# Patient Record
Sex: Male | Born: 1945 | Race: White | Hispanic: No | Marital: Married
Health system: Southern US, Community
[De-identification: ages and names within clinical notes are randomized; demographics above are authoritative.]

---

## 2004-04-11 ENCOUNTER — Ambulatory Visit: Payer: Self-pay

## 2004-11-27 ENCOUNTER — Ambulatory Visit: Payer: Self-pay

## 2007-12-17 ENCOUNTER — Ambulatory Visit: Payer: Self-pay

## 2014-08-03 ENCOUNTER — Inpatient Hospital Stay: Payer: Self-pay | Admitting: Internal Medicine

## 2014-08-13 ENCOUNTER — Ambulatory Visit: Payer: Self-pay | Admitting: Family Medicine

## 2014-08-19 ENCOUNTER — Inpatient Hospital Stay: Payer: Self-pay | Admitting: Internal Medicine

## 2014-08-20 ENCOUNTER — Ambulatory Visit: Payer: Self-pay | Admitting: Urology

## 2014-09-23 ENCOUNTER — Ambulatory Visit
Admit: 2014-09-23 | Disposition: A | Discharge status: Home / Self Care | Payer: Self-pay | Attending: Internal Medicine | Admitting: Internal Medicine

## 2014-09-29 ENCOUNTER — Inpatient Hospital Stay
Admit: 2014-09-29 | Disposition: A | Discharge status: Hospice | Payer: Self-pay | Attending: Internal Medicine | Admitting: Internal Medicine

## 2014-09-29 LAB — PHOSPHORUS: PHOSPHORUS: 2.4 mg/dL — AB

## 2014-09-29 LAB — COMPREHENSIVE METABOLIC PANEL
ALBUMIN: 2.1 g/dL — AB
ALK PHOS: 242 U/L — AB
ALT: 33 U/L
AST: 57 U/L — AB
Anion Gap: 8 (ref 7–16)
BILIRUBIN TOTAL: 0.3 mg/dL
BUN: 33 mg/dL — AB
CHLORIDE: 96 mmol/L — AB
CREATININE: 0.9 mg/dL
Calcium, Total: 10.7 mg/dL — ABNORMAL HIGH
Co2: 29 mmol/L
EGFR (African American): >60
EGFR (Non-African Amer.): >60
GLUCOSE: 93 mg/dL
Potassium: 4.6 mmol/L
Sodium: 133 mmol/L — ABNORMAL LOW
TOTAL PROTEIN: 7.8 g/dL

## 2014-09-29 LAB — URINALYSIS, COMPLETE
BILIRUBIN, UR: NEGATIVE
Blood: NEGATIVE
Glucose,UR: NEGATIVE mg/dL (ref 0–75)
Ketone: NEGATIVE
Nitrite: NEGATIVE
Ph: 5 (ref 4.5–8.0)
Protein: NEGATIVE
SPECIFIC GRAVITY: 1.02 (ref 1.003–1.030)

## 2014-09-29 LAB — PROTIME-INR
INR: 1.1
PROTHROMBIN TIME: 14.8 s

## 2014-09-29 LAB — CBC WITH DIFFERENTIAL/PLATELET
Basophil #: 0.1 10*3/uL (ref 0.0–0.1)
Basophil %: 0.5 %
Eosinophil #: 0 10*3/uL (ref 0.0–0.7)
Eosinophil %: 0.3 %
HCT: 33.5 % — ABNORMAL LOW (ref 40.0–52.0)
HGB: 10.5 g/dL — ABNORMAL LOW (ref 13.0–18.0)
LYMPHS PCT: 4.9 %
Lymphocyte #: 0.8 10*3/uL — ABNORMAL LOW (ref 1.0–3.6)
MCH: 25.4 pg — ABNORMAL LOW (ref 26.0–34.0)
MCHC: 31.3 g/dL — ABNORMAL LOW (ref 32.0–36.0)
MCV: 81 fL (ref 80–100)
MONOS PCT: 9.1 %
Monocyte #: 1.5 x10 3/mm — ABNORMAL HIGH (ref 0.2–1.0)
NEUTROS ABS: 14.2 10*3/uL — AB (ref 1.4–6.5)
Neutrophil %: 85.2 %
PLATELETS: 409 10*3/uL (ref 150–440)
RBC: 4.14 10*6/uL — ABNORMAL LOW (ref 4.40–5.90)
RDW: 17.7 % — ABNORMAL HIGH (ref 11.5–14.5)
WBC: 16.6 10*3/uL — AB (ref 3.8–10.6)

## 2014-09-29 LAB — LACTIC ACID, PLASMA
Lactic Acid, Venous: 1.4 mmol/L
Lactic Acid, Venous: 1.1 mmol/L

## 2014-09-29 LAB — TROPONIN I: Troponin-I: 0.03 ng/mL

## 2014-09-29 LAB — MAGNESIUM: Magnesium: 2.2 mg/dL

## 2014-09-30 LAB — CBC WITH DIFFERENTIAL/PLATELET
Basophil #: 0 10*3/uL (ref 0.0–0.1)
Basophil %: 0.3 %
EOS PCT: 0.2 %
Eosinophil #: 0 10*3/uL (ref 0.0–0.7)
HCT: 31.1 % — AB (ref 40.0–52.0)
HGB: 9.4 g/dL — ABNORMAL LOW (ref 13.0–18.0)
LYMPHS ABS: 0.8 10*3/uL — AB (ref 1.0–3.6)
Lymphocyte %: 6.4 %
MCH: 24.6 pg — AB (ref 26.0–34.0)
MCHC: 30.2 g/dL — ABNORMAL LOW (ref 32.0–36.0)
MCV: 82 fL (ref 80–100)
MONOS PCT: 8 %
Monocyte #: 1 x10 3/mm (ref 0.2–1.0)
Neutrophil #: 10.3 10*3/uL — ABNORMAL HIGH (ref 1.4–6.5)
Neutrophil %: 85.1 %
Platelet: 315 10*3/uL (ref 150–440)
RBC: 3.82 10*6/uL — ABNORMAL LOW (ref 4.40–5.90)
RDW: 17.7 % — ABNORMAL HIGH (ref 11.5–14.5)
WBC: 12.1 10*3/uL — AB (ref 3.8–10.6)

## 2014-09-30 LAB — COMPREHENSIVE METABOLIC PANEL
ALK PHOS: 181 U/L — AB
ALT: 28 U/L
ANION GAP: 4 — AB (ref 7–16)
Albumin: 1.7 g/dL — ABNORMAL LOW
BUN: 28 mg/dL — AB
Bilirubin,Total: 0.7 mg/dL
CO2: 27 mmol/L
CREATININE: 0.79 mg/dL
Calcium, Total: 9.6 mg/dL
Chloride: 102 mmol/L
EGFR (African American): >60
EGFR (Non-African Amer.): >60
GLUCOSE: 91 mg/dL
Potassium: 4.2 mmol/L
SGOT(AST): 49 U/L — ABNORMAL HIGH
SODIUM: 133 mmol/L — AB
TOTAL PROTEIN: 6.3 g/dL — AB

## 2014-10-01 LAB — BASIC METABOLIC PANEL
ANION GAP: 5 — AB (ref 7–16)
BUN: 22 mg/dL — AB
Calcium, Total: 10 mg/dL
Chloride: 103 mmol/L
Co2: 26 mmol/L
Creatinine: 0.71 mg/dL
EGFR (Non-African Amer.): >60
Glucose: 110 mg/dL — ABNORMAL HIGH
Potassium: 4.2 mmol/L
SODIUM: 134 mmol/L — AB

## 2014-10-01 LAB — CBC WITH DIFFERENTIAL/PLATELET
Basophil #: 0 10*3/uL (ref 0.0–0.1)
Basophil %: 0.1 %
EOS ABS: 0 10*3/uL (ref 0.0–0.7)
EOS PCT: 0 %
HCT: 32.3 % — ABNORMAL LOW (ref 40.0–52.0)
HGB: 9.8 g/dL — AB (ref 13.0–18.0)
Lymphocyte #: 0.5 10*3/uL — ABNORMAL LOW (ref 1.0–3.6)
Lymphocyte %: 3.9 %
MCH: 24.9 pg — AB (ref 26.0–34.0)
MCHC: 30.3 g/dL — ABNORMAL LOW (ref 32.0–36.0)
MCV: 82 fL (ref 80–100)
Monocyte #: 0.9 x10 3/mm (ref 0.2–1.0)
Monocyte %: 6.1 %
NEUTROS PCT: 89.9 %
Neutrophil #: 12.5 10*3/uL — ABNORMAL HIGH (ref 1.4–6.5)
Platelet: 291 10*3/uL (ref 150–440)
RBC: 3.94 10*6/uL — AB (ref 4.40–5.90)
RDW: 17.6 % — ABNORMAL HIGH (ref 11.5–14.5)
WBC: 13.9 10*3/uL — AB (ref 3.8–10.6)

## 2014-10-01 LAB — URINE CULTURE

## 2014-10-02 LAB — VANCOMYCIN, TROUGH: VANCOMYCIN, TROUGH: 37 ug/mL — AB

## 2014-10-02 LAB — URINE CULTURE

## 2014-10-03 LAB — EXPECTORATED SPUTUM ASSESSMENT W REFEX TO RESP CULTURE

## 2014-10-03 LAB — PROTIME-INR
INR: 1.2
Prothrombin Time: 15.2 secs — ABNORMAL HIGH

## 2014-10-03 LAB — CREATININE, SERUM
Creatinine: 0.55 mg/dL — ABNORMAL LOW
EGFR (African American): >60
EGFR (Non-African Amer.): >60

## 2014-10-03 LAB — PLATELET COUNT: Platelet: 261 10*3/uL (ref 150–440)

## 2014-10-03 LAB — EXPECTORATED SPUTUM ASSESSMENT W GRAM STAIN, RFLX TO RESP C

## 2014-10-03 LAB — VANCOMYCIN, TROUGH: Vancomycin, Trough: 17 ug/mL

## 2014-10-03 LAB — APTT: Activated PTT: 29.9 secs (ref 23.6–35.9)

## 2014-10-04 LAB — CREATININE, SERUM
Creatinine: 0.62 mg/dL
EGFR (African American): >60
EGFR (Non-African Amer.): >60

## 2014-10-04 LAB — CULTURE, BLOOD (SINGLE)

## 2014-10-06 LAB — BODY FLUID CULTURE

## 2014-10-17 LAB — SURGICAL PATHOLOGY

## 2014-10-18 ENCOUNTER — Other Ambulatory Visit: Payer: Self-pay

## 2014-10-23 NOTE — Consult Note (Signed)
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  During that admission, he underwent LEFT nephrostomy tube placement as well as placement perinephric drain and ultimately he improved clinically and was discharged.  He has been followed closely in the urology clinic with plans for LEFT nephrectomy, unfortunately, he is readmitted with a sepsis-like picture and was extrconditioned.  Over the past month, he was discharged from rehabilitation but has continued to fail to thrive.  His family/ home health nurse has noted poor appetite and by mouth intake, lethargy, low blood pressure, and overall lack of motivation.  Most recently, he called the office to report that he is having worsening pain, Low blood pressure, increased heart rate, change in the quality of drainage from his perinephric drain all concerning for possible infection.  He was advised to present emergently to the ED which he did.  He was evaluated and admitted to the ICU overnight with presumed sepsis.  He did have an elevated WBC  to 16 which h improved.  He was started on IV vancomycin and Zosyn.  His blood pressure and heart rate improved enough to be stabilized and transferred to the floor this afternoon  and he has not required any pressors.  Unfortunately, CT abdomen and pelvis with contrast shows new finding of evidence of diffuse metastatic disease involving liver, pelvic nodes, left adrenal gland, pelvic lymph nodes, possible caval thromubs all of which are NEW since last imaging in 07/2014.  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    SOCIAL HISTORY:  No alcohol use. Positive for tobacco usage.   FAMILY HISTORY:  Positive for coronary artery disease. No family of prostate cancer.    ALLERGIES:  No known drug allergies.   HOME MEDICATIONS:   benzonatate 200 mg p.o. 3 times daily as needed for cough, Advair 250/50 mcg inhalation 1 puff b.i.d., Perforomist 20 mcg/2 mL 2-minute ventilation b.i.d., ProAir 90 mcg inhalation 2 puffs q. 4 hours as needed for shortness of breath, Pepcid 20 mg p.o. daily, ferrous sulfate 325 mg p.o. b.i.d., senna-S 50/8.6 mg 2 tabs p.o. b.i.d. as needed for constipation, potassium 20 mEq daily.   ROS: + SOB, + weight loss, + fatigue/ weakness, + poor appetitie, + suprapubic pain, + dysuria/ urinary frequency, + n/v, + left flank pain, + fevers/ chills.   All other 12 systems reviewed in detail and otherwise negative than as above.  Exam: AVSS Gen: WD, Obese WM in NAD Skin: Warm/Dry, pale HEENT: [place]/AT, EOMI, anicteric Neck: no masses, no bruits Chest: CTA, nL respiratory effort CV: RRR without M/G/R Abd: mild SP tenderness to palpation without guarding/rebound, no palpable masses/organomegaly; Left PCN in place draining clear yellow urine; Left Percutaneous Urinoma drain in place draining dark red fluid without clots GU: severe phimosis, minimal scrotal edma Neuro: non-focal Psych: A&O x4, pleasant/cooperative Ext: 1+ LE edema   Labs: WBC 16 , elevated LFTs, Cr nl, UCx negative to date (pending), See SCM for details  Imaging: CT abd/ pelvis with contrast 09/29/14 Findings consistent with extensive metastasis in the liver, left adrenal gland,  retroperitoneal lymph nodes, lymph nodes in the porta hepatis, and possibly thrombus/ tumor thrombus involving the left common iliac vein extending into the bilateral external iliac veins. The abnormal retroperitoneal lymph  node is inseparable from the inferior portion of the left kidney which shows heterogeneous abnormal enhancement surrounding a 1.7 cm calcification. There is marked deformity of the left kidney with surrounding left perinephric fluid and air with percutaneous drain in place. The left kidney is smaller compared the pre drainage CT of August 03, 2014. 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  Assessment:   1) Ruptured XGP kidney s/p nephrostomy and perinephric drain 2) Advanced metastatic diease of unknow origin, ? renal origin although mass not apprecitated on previous imaging (severe distortion from chronic obstruction) 3) Sepsis, probably urinary cause 4) Failure to thrive  Recommend: 1) Discussed case with hospitalist, recommend biopsy of liver for metastatic work up 2)  Recommend drain exchange in setting of possible infection in IR 3) Agree with IV abx, adjust as needed based on culture results 4) Finding and implications reviewed with family in detail extensively, patient may no longer be surgical candidate pending above work up 5) Recommend heme/ onc involvement 6) No current plans for urgent surgical intervention 7) Supportivie care  Will fsee again Monday, please call Urology on call if needed over weekend     Electronic Signatures: Claris Gladden (MD)  (Signed on 08-Apr-16 20:52)  Authored  Last Updated: 08-Apr-16 20:52 by Claris Gladden (MD)

## 2014-10-23 NOTE — Consult Note (Signed)
Chief Complaint:  Subjective/Chief Complaint - Pt reports feeling better since admission. - Tolerated clears well - No BM since admission - Has not been oob   VITAL SIGNS/ANCILLARY NOTES: **Vital Signs.:   13-Feb-16 07:00  Temperature Temperature (F) 98  Celsius 36.6  Temperature Source oral  Pulse Pulse 98  Respirations Respirations 17  Systolic BP Systolic BP 75  Diastolic BP (mmHg) Diastolic BP (mmHg) 59  Mean BP 64  Pulse Ox % Pulse Ox % 95  Oxygen Delivery Room Air/ 21 %  *Intake and Output.:   Daily 13-Feb-16 07:00  Grand Totals Intake:  3337 Output:  2850    Net:  487 24 Hr.:  487  IV (Primary)      In:  3125  IV (Primary)      In:  162  IV (Secondary)      In:  50  Urine ml     Out:  1325  Other Output ml     Out:  625  Other Output ml     Out:  900  Length of Stay Totals Intake:  14237 Output:  6675    Net:  7562   Brief Assessment:  GEN no acute distress, obese   Cardiac -- JVD   Respiratory normal resp effort  no use of accessory muscles   Gastrointestinal details normal Nondistended  No rebound tenderness  No gaurding   Gastrointestinal details abnormal Tender....   Tenderness LUQ  LLQ   EXTR negative cyanosis/clubbing, positive edema   Additional Physical Exam Back: Left CVA tenderness.  None on right. Two drains in place. PCN (posterior) with maroon watery outuput.  Perinephric (anterior) drain with similar maroon, slightly viscous output.   Lab Results: Routine Chem:  13-Feb-16 06:42   Glucose, Serum 92  BUN  20  Creatinine (comp) 0.98  Sodium, Serum 140  Potassium, Serum  3.2  Chloride, Serum  111  CO2, Serum 23  Calcium (Total), Serum  8.3  Anion Gap  6  Osmolality (calc) 282  eGFR (African American) >60  eGFR (Non-African American) >60 (eGFR values <60m/min/1.73 m2 may be an indication of chronic kidney disease (CKD). Calculated eGFR, using the MRDR Study equation, is useful in  patients with stable renal function. The eGFR  calculation will not be reliable in acutely ill patients when serum creatinine is changing rapidly. It is not useful in patients on dialysis. The eGFR calculation may not be applicable to patients at the low and high extremes of body sizes, pregnant women, and vegetarians.)  Routine Hem:  13-Feb-16 06:42   WBC (CBC) 9.0  RBC (CBC)  2.83  Hemoglobin (CBC)  7.4  Hematocrit (CBC)  23.5  Platelet Count (CBC) 202  MCV 83  MCH 26.2  MCHC  31.6  RDW  17.5  Neutrophil % 86.2  Lymphocyte % 8.1  Monocyte % 5.2  Eosinophil % 0.1  Basophil % 0.4  Neutrophil #  7.7  Lymphocyte #  0.7  Monocyte # 0.5  Eosinophil # 0.0  Basophil # 0.0 (Result(s) reported on 06 Aug 2014 at 07:13AM.)   Assessment/Plan:  Assessment/Plan:  Assessment 69y/o male with chronically obstructed left kidney s/p massively dilated collecting system rupture with large perinephic colleciton (urinoma) HD #4 s/p drainage of kidney via PCN and peri-nephric collection with drain.  The patient appears to be doing clinically better. He had brief pressor requirement but off for last 16 hours.  His BP is rising.  His WBC is down.  Interestingly  there has been now growth on any cx.  Renal fxn stable (cr decreasing likely representing cessation of urine reabsorption now that drains are in place).  Hct stable but low but pt is asx.   Plan - Continue drainage.  OK to flush each drain daily or BID with 5-10cc of saline.  Pt will likely go home with drains in place (until he gets nephrectomy), may need home health assistance. -  I would consider repeat axial imaging prior to discharge to ensrue there are no residual fluid collections. - Continue IV Abx. F/U cx data.  ID assisting. Would plan for him to tranistion to oral abx (and go home with these). - OK to advance diet. Would go slow (recommend multiple small meals) - Bowel regimen. Consider suppository to aid BM - OOB and ambulation.  Recommend PT assistance. - Consider transfusion if  pt is symptomatic with activity and needs more fluid.  Think drop in blood count is from dilution (30lbs up since admssion) and from mild bleeding from rupture collecting system - Pt will need nephrectomy in near future after he has stabilized well from this current process. - Please call/page with any questions or concerns.   Electronic Signatures: Osa Craver (MD)  (Signed 13-Feb-16 09:09)  Authored: Chief Complaint, VITAL SIGNS/ANCILLARY NOTES, Brief Assessment, Lab Results, Assessment/Plan   Last Updated: 13-Feb-16 09:09 by Osa Craver (MD)

## 2014-10-23 NOTE — Consult Note (Signed)
Chief Complaint:  Subjective/Chief Complaint Dispo planning today, working with PT, ambulated to door with walker.  SP pain nearly resolved.  F/u CT scan showed no new collection, tubes in good position.  Tolerating small amount food without vomiting.   VITAL SIGNS/ANCILLARY NOTES: **Vital Signs.:   02-Mar-16 12:51  Vital Signs Type Q 4hr  Temperature Temperature (F) 98.1  Celsius 36.7  Temperature Source oral  Pulse Pulse 104  Respirations Respirations 18  Systolic BP Systolic BP 353  Diastolic BP (mmHg) Diastolic BP (mmHg) 74  Mean BP 87  Pulse Ox % Pulse Ox % 96  Pulse Ox Activity Level  At rest  Oxygen Delivery Room Air/ 21 %  *Intake and Output.:   Daily 02-Mar-16 07:00  Grand Totals Intake:  2387 Output:  1625    Net:  762 24 Hr.:  762  Oral Intake      In:  240  IV (Primary)      In:  2147  Urine ml     Out:  825  Other Output ml     Out:  230  Other Output ml     Out:  570  Length of Stay Totals Intake:  13430 Output:  7365    Net:  6065   Brief Assessment:  GEN well developed, well nourished, no acute distress, obese, pale appearing   Cardiac Regular  -- LE edema   Respiratory normal resp effort  clear BS  no use of accessory muscles   Gastrointestinal Normal  soft, NT, ND, no suprapubic tenderness   Gastrointestinal details normal No gaurding  No rigidity   EXTR negative cyanosis/clubbing   Additional Physical Exam Left nephrostomy- clear yellow urine Pelvic drain- cranberry colored urine   Lab Results: Routine Chem:  02-Mar-16 04:40   Glucose, Serum 69  BUN  6  Creatinine (comp)  0.53  Sodium, Serum 141  Potassium, Serum 3.6  Chloride, Serum 106  CO2, Serum 26  Calcium (Total), Serum 8.8  Anion Gap 9  Osmolality (calc) 277  eGFR (African American) >60  eGFR (Non-African American) >60 (eGFR values <35m/min/1.73 m2 may be an indication of chronic kidney disease (CKD). Calculated eGFR, using the MRDR Study equation, is useful in   patients with stable renal function. The eGFR calculation will not be reliable in acutely ill patients when serum creatinine is changing rapidly. It is not useful in patients on dialysis. The eGFR calculation may not be applicable to patients at the low and high extremes of body sizes, pregnant women, and vegetarians.)  Routine Hem:  02-Mar-16 04:40   WBC (CBC) 9.5  RBC (CBC)  3.59  Hemoglobin (CBC)  9.5  Hematocrit (CBC)  29.9  Platelet Count (CBC) 306  MCV 83  MCH 26.6  MCHC  31.8  RDW  18.6  Neutrophil % 81.6  Lymphocyte % 9.4  Monocyte % 7.1  Eosinophil % 0.7  Basophil % 1.2  Neutrophil #  7.7  Lymphocyte #  0.9  Monocyte # 0.7  Eosinophil # 0.1  Basophil # 0.1 (Result(s) reported on 24 Aug 2014 at 05:57AM.)   Radiology Results: CT:    01-Mar-16 12:12, CT Abdomen and Pelvis Without Contrast  CT Abdomen and Pelvis Without Contrast   REASON FOR EXAM:    (1) lower abd pain; (2) lower pelvic pain  COMMENTS:   LMP: (Male)    PROCEDURE: CT  - CT ABDOMEN AND PELVIS W0  - Aug 23 2014 12:12PM     CLINICAL  DATA:  Increasing left lower abdomen pain, post left  percutaneous nephrostomy forleft UPJ obstruction due to large  calculus    EXAM:  CT ABDOMEN AND PELVIS WITHOUT CONTRAST    TECHNIQUE:  Multidetector CT imaging of the abdomen and pelvis was performed  following the standard protocol without IV contrast.  COMPARISON:  CT abdomen pelvis of 08/04/2014    FINDINGS:  On lung window images, there is a 7 mm noncalcified nodule within  the right lower lobe present. If the patient is at high risk for  bronchogenic carcinoma, follow-up chest CT at 3-28month is  recommended. If the patient is at low risk for bronchogenic  carcinoma, follow-up chest CT at 6-12 months is recommended. This  recommendation follows the consensus statement: Guidelines for  Management of Small Pulmonary Nodules Detected on CT Scans: A  Statement from the [facility] published in  Radiology  2005; 237:395-400. Pleural thickening in both lung bases remains  with some scarring in the right lower lobe as well. Faintly  calcified hemidiaphragms are noted most likely asbestos related.  On this unenhanced study, the liver is unremarkable. There does  appear to be a 1.5 cm gallstone within the gallbladder which is  faintly calcified. The pancreas is normal in size and the pancreatic  duct is not dilated. The right adrenal gland is unremarkable.The  left adrenal gland is difficult to visualize due to the large left  retroperitoneal soft tissue mass. On this unenhanced study there are  small peripheral defects in the spleen inferiorly which could  represent small areas of infarction. It is difficult to separate  left renal parenchyma from the perinephric process on this  unenhanced study. The drainage catheter is unchanged but the overall  size of the left perinephric/ renal mass has diminished now  measuring 14.5 x 9.9 cm compared to 20.1 x 11.4 cm on 08/04/2014.  The calculi noted previously within the left renal pelvis and  proximally left ureter are unchanged. In view of the difficulty  delineating renal parenchyma from the mass, zanthogranulomatous  pyelonephritis would be a consideration. The right kidney is  unremarkable other than several small nonobstructing right renal  calculi. There is still strandiness surrounding the abdominal aorta  at the level of the renal hilus, which probably is related to the  process involving the left kidney. The abdominal aorta is normal in  caliber.    The urinary bladder is unremarkable. The prostate is normal in size,  again with calcifications present. No pelvic mass or fluid is seen.  The colon is decompressed. The terminal ileum and the appendix are  unremarkable. The lumbar vertebrae are normal alignment with  degenerative disc disease most marked at L4-5.     IMPRESSION:  1. Decrease in size of the left retroperitoneal  mass with difficulty  separating the left renal parenchyma from the left retroperitoneal  process. Cannot exclude xanthogranulomatous pyelonephritis. No  change in position of left renal pelvic and proximal left ureteral  calculi.  2. 1.5 cm gallstone within gallbladder.  3. 7 mm noncalcified nodule in theright lower lobe. Followup  recommendations are given above.      Electronically Signed    By: PIvar DrapeM.D.    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  Improving,  dispo planning today.  CT abd/ pelvis yesterday show drains in good position, no new or enlarging collection.  He will eventually need left nephrectomy but will do best if deconditioning improved preop/ optimize nutrition which was stressed to the patient today.  All questions answered.   Plan -continue drain care -dispo planning -encourage amulation/ nutrition -Urology to sign off, please page with any questions or concerns -we will arrange for clinic follow up in 1-2 weeks to schedule nephrectomy   Electronic Signatures: Sherlynn Stalls (MD)  (Signed 02-Mar-16 16:42)  Authored: Chief Complaint, VITAL SIGNS/ANCILLARY NOTES, Brief Assessment, Lab Results, Radiology Results, Assessment/Plan   Last Updated: 02-Mar-16 16:42 by Sherlynn Stalls (MD)

## 2014-10-23 NOTE — Discharge Summary (Signed)
PATIENT NAME:  [name] [name], [name] [name] MR#:  [id number] DATE OF BIRTH:  1946-01-06  DATE OF ADMISSION:  08/19/2014 DATE OF DISCHARGE:  08/25/2014   PRESENTING COMPLAINT: Nausea, vomiting, and weakness.   DISCHARGE DIAGNOSES:  1.  Sepsis resolved.  2.  Urinary tract infection with Candida.  3.  Left nephrolithiasis status post percutaneous nephrostomy tube placement.  4.  Left-sided uroma status post perinephric tube placement. 5.  Chronic obstructive pulmonary disease.  6.  Ongoing tobacco abuse.  7.  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  8.  Gastroesophageal reflux disease.   PRIMARY CARE PROVIDER: Evelene CroonMeindert Niemeyer, MD    PRIMARY UROLOGIST: Vanna ScotlandAshley Brandon. MD  CONSULTATIONS:  1.  Dr. Vanna ScotlandAshley Brandon, urology.  2.  Dr. Pixie CasinoJ. Kim, urology.  3.  Dr. Trey Paulaichard Hart, urology.   PROCEDURES:  CT scan of abdomen and pelvis without contrast, 08/23/2014, showing decreased in size of the left retroperitoneal mass with difficulty separating left renal parenchyma from left retroperitoneal process. Cannot exclude xanthogranulomatous pyelonephritis, no change in position of the left renal, pelvic and proximal left ureteral calculi, 1.5 cm gallstone within the gallbladder, 7 mm noncalcified nodule in the right lower lobe.   HISTORY OF PRESENT ILLNESS: This 69 year old male with [facility] after a left percutaneous nephrostomy tube placement for obstructing ureteral nephrolithiasis septic shock is sent from the office of his urologist with a complaint of ongoing nausea, vomiting, and generalized weakness.   HOSPITAL COURSE BY PROBLEM:  1.  Sepsis: Due to a urinary tract infection, now resolved.  On presentation, the patient was hypotensive and tachycardic. Blood cultures negative x 2.  Urine culture shows 15,000 colony forming units of Candida albicans.  2.  Urinary tract infection due to candida albicans: The patient was initially started  on broad-spectrum antibacterials but was changed to fluconazole once culture results returned. He did have some initial suprapubic tenderness which has now resolved. He will remain on the fluconazole to complete a 2 week course. Urinary tract infection is complicated by nephrolithiasis, likely a nidus of infection.  3.  Left-sided obstructing nephrolithiasis status post percutaneous nephrostomy tube placement: The patient will be followed by urology.  He also had a perinephric uroma which is being drained with a perinephric tube at this point.  He will follow up with urology in 2 weeks for consideration of left nephrectomy.  3.  Chronic obstructive pulmonary disease: Respiratory status has been stable throughout the hospitalization. He continues on Advair with no respiratory complaints.  4.  Osteoarthritis with opiate dependence: The patient was on scheduled opiates at home, but is on opiates only as needed during this hospitalization. He takes oxycodone every 8 hours as needed and has not actually needed them this frequently.  5.  Generalized weakness: The patient is being discharged to skilled nursing facility for ongoing physical therapy.  6.  Clinical malnutrition:  The patient has lost a significant amount of weight due to lack of appetite, likely due to nephrolithiasis and infection.  He is on Ensure twice a day to boost his nutrition.   DISCHARGE PHYSICAL EXAMINATION:  VITAL SIGNS: Temperature 97.4, pulse 98, respirations 18, blood pressure 124/82, oxygenation 97% on room air.  GENERAL: No acute distress.  RESPIRATORY: Lungs clear to auscultation bilaterally with good air movement.  CARDIOVASCULAR: Regular rate and rhythm. No murmurs, rubs, or gallops. No peripheral edema. Peripheral pulses 2+.  ABDOMEN: Soft, nontender, nondistended. No suprapubic tenderness, bowel sounds are normal.  No guarding,  no rebound, no hepatosplenomegaly.  NEUROLOGICAL: Cranial nerves II through XII grossly intact.  Strength and sensation are intact.  PSYCHIATRIC: The patient alert and oriented with good insight into his clinical conditions.   LABORATORY DATA: Sodium 141, potassium 3.6, chloride 106, bicarbonate 26, BUN 6, creatinine 0.53, hemoglobin 9.5. White blood cell count 9.5, platelets 306,000, MCV is 83.  Urine culture positive for Candida albicans, initial UA shows 62 white blood cells, initial lactic acid 1.5.   DISCHARGE MEDICATIONS:  1.  Advair Diskus 100 mcg/50 mcg 1 puff inhaled twice a day.  2.  Acetaminophen 325 mg 2 tablets every 4 hours as needed for pain or fever.  3.  Docusate sodium 100 mg 1 capsule twice a day.  4.  Famotidine 20 mg 1 tablet once a day.  5.  Ferrous sulfate 325 mg 1 tablet twice a day.  6.  Oxycodone 10 mg 1 tablet every 8 hours as needed for pain.  7.  Meclizine 25 mg 1 tablet 3 times a day as needed for dizziness. 8.  Fluconazole 200 mg 1 tablet once a day. Stop date 09/02/2014.  9.  Ensure 240 mL twice a day.   CONDITION ON DISCHARGE: Stable.   DISPOSITION: Discharge to skilled nursing facility.   DISCHARGE INSTRUCTIONS:   DIET: Low sodium.  ACTIVITY: As tolerated.   FOLLOW-UP:  Follow-up within 2 weeks with urology.   TIME SPENT ON DISCHARGE:  45 minutes.      ____________________________ Ena Dawley. Clent Ridges, MD cpw:DT [name]: 08/25/2014 09:18:00 ET T: 08/25/2014 10:03:06 ET JOB#: [id number]  cc: Santina Evans P. Clent Ridges, MD, <Dictator> Claris Gladden, MD Meindert A. Lacie Scotts, MD Gale Journey MD ELECTRONICALLY SIGNED 09/03/2014 10:54

## 2014-10-23 NOTE — Consult Note (Signed)
Chief Complaint:  Subjective/Chief Complaint Hypotension and tachycardia not improved, stable.  No fevers.  All culture results negative.  AM labs pending.  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   VITAL SIGNS/ANCILLARY NOTES: **Vital Signs.:   12-Feb-16 06:00  Vital Signs Type Routine  Pulse Pulse 110  Respirations Respirations 18  Systolic BP Systolic BP 80  Diastolic BP (mmHg) Diastolic BP (mmHg) 50  Mean BP 60  Pulse Ox Activity Level  At rest  Oxygen Delivery Room Air/ 21 %  *Intake and Output.:   Daily 12-Feb-16 07:00  Grand Totals Intake:  5710 Output:  2050    Net:  3660 24 Hr.:  3660  IV (Primary)      In:  10  IV (Primary)      In:  5500  IV (Secondary)      In:  200  Urine ml     Out:  1300  Other Output ml     Out:  750  Length of Stay Totals Intake:  10650 Output:  3825    Net:  6825   Brief Assessment:  GEN no acute distress, obese   Respiratory normal resp effort  no use of accessory muscles   Gastrointestinal Mild left CVA tenderness, nephrostomy tube in place draining coke colored urine, no clots   Gastrointestinal details normal Soft  mild improving LLQ tenderness   EXTR negative cyanosis/clubbing   Radiology Results: CT:    11-Feb-16 14:59, CT Abdomen and Pelvis Without Contrast  CT Abdomen and Pelvis Without Contrast   REASON FOR EXAM:    (1) Left XGP kidney rupture s/p left PCN, assess   residual collection; (2) Left X  COMMENTS:       PROCEDURE: CT  - CT ABDOMEN AND PELVIS W0  - Aug 04 2014  2:59PM     CLINICAL DATA:  Chronically obstructed left kidney with obstructing  proximal ureteral calculus. 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 The patient is status post placement of a  percutaneous nephrostomy catheter yesterday which has been draining  blood tinged fluid. Clinically,  the patient still has an elevated  white blood cell count, hypotension and worsening renal function.  Imaging is performed to assess for undrained fluid that may require  additional drainagecatheter placement.  EXAM:  CT ABDOMEN AND PELVIS WITHOUT CONTRAST    TECHNIQUE:  Multidetector CT imaging of the abdomen and pelvis was performed  following the standard protocol without IV contrast.    COMPARISON:  CT of the abdomen and pelvis performed yesterday and CT  of the chest with contrast earlier today.    FINDINGS:  A percutaneous drain from a left flank approach enters the markedly  distorted and chronically obstructed left kidney. This courses  superiorly and medially into a dominant pocket of fluid that now  appears decompressed compared to the CT yesterday. There remains a  non-drained drained component of fluid more anteriorly and  inferiorly that measures roughly 12.5 cm in greatest diameter. This  area would be amenable to additional percutaneous catheter drainage  from a lateral/ posterolateral approach to avoid violating the  peritoneal cavity.    Degree of contained hemorrhage within the obstructed kidney as well  as hemorrhage and extravasated fluid inferiorly extending into the  pelvis shows no significant change and there is no evidence to  suggest increased hemorrhage by imaging.  The left Kidney clearly is nonfunctional. Excreted contrast is  present on the right from the CT scan this morning and contrast is  seen throughout the right ureter and in the bladder. There is  absolutely no excretion of contrast by the left kidney. Two separate  calculi are again identified on the left including a probable  culprit calculus near the expected position of the UPJ or proximal  ureter measuring approximately 1.6 x 1.9 x 2.8 cm. Smaller more  superior calculus measures approximately 10 mm.    A small amount of free fluid is present around the liver. Splenic  hypodensity is  less apparent. Gallstone identified.There is a left  inguinal hernia containing fat as well as a small amount of fluid.  Stable degenerative disease of the spine.     IMPRESSION:  After insertion of one percutaneous drain, a superior and medial  dominant pocket of fluid at the level ofthe chronically obstructed  left kidney now appears decompressed. There remains than undrained  component of fluid anteriorly and inferiorly. This should be  amenable to percutaneous CT-guided catheter drainage. The left  kidney is nonfunctional. Degree of contained hemorrhage as well as  extravasated fluid/hemorrhage extending into the pelvis appears  stable with no evidence of increased hemorrhage by imaging.      Electronically Signed    By: Irish LackGlenn  Yamagata M.D.    On: 08/04/2014 16:43         Verified By: Reola CalkinsGLENN T. YAMAGATA, M.D.,   Assessment/Plan:  Assessment/Plan:  Assessment 69 yo M with chronically obstructed, severely hydronephrotic (XGP) left kidney with presenting with left kidney rupture, urinary extravasation, and sepsis.  Patient is PPD #2 s/p left PCN placement.  He remains in ICU, stable tachycardic and hypotention but not requiring pressors.   No fevers and culture negative to date.  AM labs pending.  CT abd/ pelvis shows stable left retroperitoneal collection (12.5 cm) outside of kidney.  Case discussed with IR yesterday, plan for second drain placement today to drain urinoma.   Plan -NPO for procedure today in IR -continue abx -AM labs ordered, trend Cr and Hct -consider Foley placement if Cr continues to trend upwards for maximal urinary decompression  -if hypotension/ tachycardia fails to improve with drainage of urinoma, consider further work up for other underlying etiologies   Electronic Signatures: Claris GladdenBrandon, Lathaniel Legate J (MD)  (Signed 12-Feb-16 07:45)  Authored: Chief Complaint, VITAL SIGNS/ANCILLARY NOTES, Brief Assessment, Radiology Results, Assessment/Plan   Last Updated:  12-Feb-16 07:45 by Claris GladdenBrandon, Trenell Moxey J (MD)

## 2014-10-23 NOTE — Consult Note (Signed)
Chief Complaint:  Subjective/Chief Complaint - HD#5 - Pt feeling a little better - Tolerated solid diet - did NOT get out of bed - last BM 1 week ago   VITAL SIGNS/ANCILLARY NOTES: **Vital Signs.:   14-Feb-16 07:00  Pulse Pulse 108  Respirations Respirations 20  Systolic BP Systolic BP 706  Diastolic BP (mmHg) Diastolic BP (mmHg) 67  Mean BP 83  Pulse Ox % Pulse Ox % 100  Oxygen Delivery Room Air/ 21 %  *Intake and Output.:   Shift 14-Feb-16 07:00  IV (Primary)      In:  1000  IV (Secondary)      In:  125  Urine ml     Out:  775  Other Output ml     Out:  525  Other Output ml     Out:  180  Length of Stay Totals Intake:  17257 Output:  9630    Net:  2376    Daily 07:00  Grand Totals Intake:  3020 Output:  2831    Net:  88 24 Hr.:  590  Oral Intake      In:  120  IV (Primary)      In:  2625  IV (Secondary)      In:  275  Urine ml     Out:  1725  Other Output ml     Out:  525  Other Output ml     Out:  180  Length of Stay Totals Intake:  17257 Output:  9630    Net:  7627   Brief Assessment:  GEN no acute distress, obese   Cardiac + LE edema  -- JVD   Respiratory normal resp effort  no use of accessory muscles   Gastrointestinal details normal Soft  Nondistended  No masses palpable  No gaurding  No rigidity   Gastrointestinal details abnormal Tender....   Tenderness LLQ  RLQ   EXTR positive edema, trace   Additional Physical Exam Patient is tender to palpation in LLQ>RLQ of abdomen but withotu rebound or guarding (this is not new).  He continues to have Left CVA tenderness. His PCN and perinephric drains continue to have similar appearing blood tinged output, appear watery.   Lab Results: Routine Micro:  10-Feb-16 07:30   Micro Text Report BLOOD CULTURE   COMMENT                   NO GROWTH IN 48 HOURS   ANTIBIOTIC                       Micro Text Report BLOOD CULTURE   COMMENT                   NO GROWTH IN 48 HOURS   ANTIBIOTIC                        Culture Comment NO GROWTH IN 48 HOURS  Result(s) reported on 05 Aug 2014 at 07:00AM.  Culture Comment NO GROWTH IN 48 HOURS  Result(s) reported on 05 Aug 2014 at 07:00AM.    10:32   Micro Text Report BLOOD CULTURE   COMMENT                   NO GROWTH IN 48 HOURS   ANTIBIOTIC  Specimen Source RIGHT HAND  Culture Comment NO GROWTH IN 48 HOURS  Result(s) reported on 05 Aug 2014 at 10:00AM.    10:50   Micro Text Report MISC AER/ANAEROBIC CULT.   COMMENT                   NO GROWTH IN 48 HOURS   GRAM STAIN                RARE WHITE BLOOD CELLS   GRAM STAIN                NO ORGANISMS SEEN   ANTIBIOTIC                       Specimen Source LT NEPHROSTOMY TUBE  Culture Comment NO GROWTH IN 94 HOURS  11-Feb-16 12:13   Micro Text Report URINE CULTURE   COMMENT                   MIXED BACTERIAL ORGANISMS   COMMENT                   RESULTS SUGGESTIVE OF CONTAMINATION   ANTIBIOTIC                       Specimen Source CLEAN CATCH  Culture Comment MIXED BACTERIAL ORGANISMS  Routine Chem:  14-Feb-16 06:06   Glucose, Serum 78  BUN 14  Creatinine (comp) 0.77  Sodium, Serum 142  Potassium, Serum  3.3  Chloride, Serum  110  CO2, Serum 23  Calcium (Total), Serum  8.1  Anion Gap 9  Osmolality (calc) 282  eGFR (African American) >60  eGFR (Non-African American) >60 (eGFR values <74m/min/1.73 m2 may be an indication of chronic kidney disease (CKD). Calculated eGFR, using the MRDR Study equation, is useful in  patients with stable renal function. The eGFR calculation will not be reliable in acutely ill patients when serum creatinine is changing rapidly. It is not useful in patients on dialysis. The eGFR calculation may not be applicable to patients at the low and high extremes of body sizes, pregnant women, and vegetarians.)  Routine Hem:  14-Feb-16 06:06   WBC (CBC) 7.7  RBC (CBC)  2.85  Hemoglobin (CBC)  7.5  Hematocrit (CBC)  23.7  Platelet  Count (CBC) 220  MCV 83  MCH 26.2  MCHC  31.5  RDW  17.8  Neutrophil % 82.6  Lymphocyte % 10.4  Monocyte % 5.4  Eosinophil % 0.8  Basophil % 0.8  Neutrophil # 6.4  Lymphocyte #  0.8  Monocyte # 0.4  Eosinophil # 0.1  Basophil # 0.1 (Result(s) reported on 07 Aug 2014 at 06:54AM.)   Radiology Results: CT:    11-Feb-16 14:59, CT Abdomen and Pelvis Without Contrast  CT Abdomen and Pelvis Without Contrast   REASON FOR EXAM:    (1) Left XGP kidney rupture s/p left PCN, assess   residual collection; (2) Left X  COMMENTS:       PROCEDURE: CT  - CT ABDOMEN AND PELVIS W0  - Aug 04 2014  2:59PM     CLINICAL DATA:  Chronically obstructed left kidney with obstructing  proximal ureteral calculus. 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 The patient is status post placement of a  percutaneous nephrostomy catheter yesterday which has been draining  blood tinged fluid. Clinically, the patient still has an  elevated  white blood cell count, hypotension and worsening renal function.  Imaging is performed to assess for undrained fluid that may require  additional drainagecatheter placement.  EXAM:  CT ABDOMEN AND PELVIS WITHOUT CONTRAST    TECHNIQUE:  Multidetector CT imaging of the abdomen and pelvis was performed  following the standard protocol without IV contrast.    COMPARISON:  CT of the abdomen and pelvis performed yesterday and CT  of the chest with contrast earlier today.    FINDINGS:  A percutaneous drain from a left flank approach enters the markedly  distorted and chronically obstructed left kidney. This courses  superiorly and medially into a dominant pocket of fluid that now  appears decompressed compared to the CT yesterday. There remains a  non-drained drained component of fluid more anteriorly and  inferiorly that measures roughly 12.5 cm in greatest diameter. This  area would be  amenable to additional percutaneous catheter drainage  from a lateral/ posterolateral approach to avoid violating the  peritoneal cavity.    Degree of contained hemorrhage within the obstructed kidney as well  as hemorrhage and extravasated fluid inferiorly extending into the  pelvis shows no significant change and there is no evidence to  suggest increased hemorrhage by imaging.    The left Kidney clearly is nonfunctional. Excreted contrast is  present on the right from the CT scan this morning and contrast is  seen throughout the right ureter and in the bladder. There is  absolutely no excretion of contrast by the left kidney. Two separate  calculi are again identified on the left including a probable  culprit calculus near the expected position of the UPJ or proximal  ureter measuring approximately 1.6 x 1.9 x 2.8 cm. Smaller more  superior calculus measures approximately 10 mm.    A small amount of free fluid is present around the liver. Splenic  hypodensity is less apparent. Gallstone identified.There is a left  inguinal hernia containing fat as well as a small amount of fluid.  Stable degenerative disease of the spine.     IMPRESSION:  After insertion of one percutaneous drain, a superior and medial  dominant pocket of fluid at the level ofthe chronically obstructed  left kidney now appears decompressed. There remains than undrained  component of fluid anteriorly and inferiorly. This should be  amenable to percutaneous CT-guided catheter drainage. The left  kidney is nonfunctional. Degree of contained hemorrhage as well as  extravasated fluid/hemorrhage extending into the pelvis appears  stable with no evidence of increased hemorrhage by imaging.      Electronically Signed    By: Aletta Edouard M.D.    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  The patient is making progress. His BP was intermittently low yesterday but overall trending up.  His WBC remains wnl range as does his creatinine.  There remains no growth on any of his cx.  His Hct is stable but low but pt is asx (but has not attempted ambulation).   Plan Continue drainage.  OK to flush each drain daily or BID with 5-10cc of saline.  Pt will likely go home with drains in place (until he gets nephrectomy), may need home health  assistance. - Consider repeat axial imaging prior to discharge to ensure there are no residual fluid collections that may be amenable to reposistioning of dain(s) - Continue IV Abx. F/U cx data.  ID assisting. Would plan for him to tranistion to oral abx (and go home with these) soon - Continue regular diet. HLIV.  Pt is tolerating PO and is 7lbs up since admission (and he says its 20lbs from his baseline) which is likely from IVF resuscitation.  - Bowel regimen. Pt refuses suppository, consider po ducolax - Strongly encouraged pt to get OOB and ambulation.  Recommend PT assistance. - Hct is stable.  Would consider transfusion if pt is hypotensive and symptomatic that does not respond to bolus of fluid.   - Pt will need nephrectomy in near future after he has stabilized well from this current process. - Please call/page with any questions or concerns.   Electronic Signatures: Osa Craver (MD)  (Signed 14-Feb-16 08:30)  Authored: Chief Complaint, VITAL SIGNS/ANCILLARY NOTES, Brief Assessment, Lab Results, Radiology Results, Assessment/Plan   Last Updated: 14-Feb-16 08:30 by Osa Craver (MD)

## 2014-10-23 NOTE — H&P (Signed)
PATIENT NAME:  [name] [name], [name] [name] MR#:  [id number] DATE OF BIRTH:  [birth year]  DATE OF ADMISSION:  08/19/2014  REFERRING PHYSICIAN:  Dr. Governor Rooks.    PRIMARY CARE PRACTITIONER:  Dr. Evelene Croon.     PRIMARY UROLOGIST:  Dr. Vanna Scotland.   ADMITTING PHYSICIAN:  Dr. Jonnie Kind.   CHIEF COMPLAINT: Ongoing nausea, vomiting, with generalized weakness and dizziness for the past 7-10 days.   HISTORY OF PRESENT ILLNESS:  A 69 year old Caucasian male with a recent discharge from [facility] following left percutaneous nephrostomy tube placement for obstructing uretero-nephrolithiasis, septic shock, was sent from the office of his urologist with complaints of ongoing nausea, vomiting with generalized weakness and dizziness for the past 7-10 days. The patient was recently admitted 2-3 weeks ago to [facility] with septic shock and left obstructing nephrolithiasis, during which time he underwent percutaneous nephrostomy tube placement and discharge to rehabilitation and from the rehabilitation he went home 2 days ago and had a followup appointment with his urologist today.  In his urologist's office he was noted to have this ongoing nausea and vomiting with generalized weakness and dizziness ongoing for the past 7-10 days, hence was sent by his urologist to the Emergency Room for further evaluation and management. The patient was evaluated by the ED physician and was found to be tachycardic and with elevated white blood cell count and was initially given IV fluids following which heart rate kind of stabilized, but again heart rate started increasing and was noted to be in dehydration and also with systemic inflammatory response syndrome and a possible UTI, hence hospitalist service was consulted for further evaluation and management. No history of any fever or chills. As mentioned the patient has been having ongoing nausea and vomiting and decreased oral intake for the past 7-10  days and has been feeling dizzy and weak. No focal weakness or numbness. No chest pain. No shortness of breath. No cough. In the Emergency Room blood cultures and urine cultures were obtained and the patient was started on IV ceftriaxone per urology recommendation to cover for possible urinary tract infection.    PAST MEDICAL HISTORY:  1. COPD.   2. Gastroesophageal reflux disease.  3. Left nephrolithiasis status post percutaneous nephrostomy tube placement 2 weeks ago.   PAST SURGICAL HISTORY: No history of any surgeries in the past.   ALLERGIES: No known drug allergies.   FAMILY HISTORY: Brother with myocardial infarction.    SOCIAL HISTORY: He is married, lives at home with his wife. He is a retired Geneticist, molecular and a history of smoking about half pack per day for the past many years. Denies any alcohol or substance abuse.    HOME MEDICATIONS:  1. Acetaminophen 2 tablets every 4 hours as needed for pain.  2. Advair Diskus 100/50 mcg 1 inhalation 2 times a day.  3. Ciprofloxacin 500 mg tablet 1 tablet orally every 12 hours.  4. Docusate sodium 100 mg oral capsule 1 capsule 2 times a day.  5. Famotidine 20 mg tablet 1 tablet orally once a day.  6. Ferrous sulfate 325 mg 1 tablet orally 2 times a day.  7. Macrobid monohydrate 100 mg oral capsule 1 capsule every 12 hours.  8. Oxycodone 10 mg tablet 1 tablet orally 2 times a day.  9. Oxycodone 15 mg oral tablet 1 tablet every 4 hours as needed for pain.  10. Polyethylene glycol oral powder for constipation as needed.      REVIEW OF  SYSTEMS:    CONSTITUTIONAL: Negative for fever or chills. Positive for fatigue, generalized weakness with dizziness.  EYES: Negative for blurred vision, double vision. No pain. No redness. No discharge.  EARS, NOSE, AND THROAT: Negative for tinnitus, ear pain, hearing loss, epistaxis, nasal discharge.  RESPIRATORY: Negative for cough, wheezing, dyspnea, hemoptysis, or painful respiration.   CARDIOVASCULAR: Negative for chest pain, palpitations. No syncopal episodes. No orthopnea. No dyspnea on exertion. No pedal edema. Positive for dizziness.  GASTROINTESTINAL: Positive for nausea, vomiting for the past 7-10 days with lower abdominal discomfort. No diarrhea. No hematemesis. No melena.  GENITOURINARY: Status post left nephrostomy tube draining dark-colored urine. HEMATOLOGIC AND LYMPHATIC: Negative for easy bruising, bleeding.  INTEGUMENTARY:  Negative for acne, skin rash, or lesions.  MUSCULOSKELETAL: Negative for neck pain or back pain. No history of arthritis or gout.  NEUROLOGICAL: Negative for focal weakness, numbness. No history of CVA, TIA, or seizure disorder.  PSYCHIATRIC: Negative for anxiety, insomnia, depression.   PHYSICAL EXAMINATION:  VITAL SIGNS: On arrival to the Emergency Room temperature 98.8 degrees Fahrenheit, pulse rate 117 per minute, respirations 18 per minute, blood pressure 131/72, O2 saturation 96% on room air. Current vital signs, pulse rate 97 per minute, respirations 20 per minute, blood pressure 138/80, oxygen saturation is 100% on room air.  GENERAL: Well developed, well nourished, alert, in no acute distress, comfortable, resting in the bed at this time.  HEAD: Atraumatic, normocephalic.  EYES: Pupils are equal, react to light and accommodation. No conjunctival pallor. No icterus. Extraocular movements normal, intact.  NOSE: No drainage. No lesions.  EARS: No drainage. No external lesions.  ORAL CAVITY: No mucosal lesions. No exudates.  NECK: Supple. No JVD. No thyromegaly. No carotid bruit. Range of motion of neck within normal limits.  RESPIRATORY: Good respiratory effort. Not using accessory muscles of respiration. Bilateral vesicular breath sounds and no rales or rhonchi.  CARDIOVASCULAR: S1, S2 regular. No murmurs, gallops, or clicks. Pulses equal at carotid, femoral, and pedal pulses. No peripheral edema.  GASTROINTESTINAL: Abdomen soft, mild  lower abdominal tenderness present. No hepatosplenomegaly. No masses. No rigidity. No guarding. Percutaneous nephrostomy tubes on the left flank present and draining dark-colored urine.  GENITOURINARY: Deferred.  MUSCULOSKELETAL: No joint tenderness or effusion. Range of motion is adequate. Strength and tone equal bilaterally.  SKIN: Inspection within normal limits. No obvious wounds.  LYMPHATIC: No cervical lymphadenopathy.  VASCULAR: Good dorsalis pedis and posterior tibial pulses.  NEUROLOGICAL: Alert, awake, and oriented x 3. Cranial II through XII grossly intact. No sensory deficit. Motor strength 5/5 bilaterally.  DTRs 2 + bilateral, symmetrical. PSYCHIATRIC: Alert, awake, and oriented x 3. Judgment and insight adequate. Memory and mood within normal limits.   LABORATORY DATA: Serum glucose 104, BUN 11, creatinine 0.77, sodium 138, potassium 3.5, chloride 102, bicarbonate 26, total calcium 9.1. Lipase 68. Total protein 7.0, albumin 2.3, total bilirubin 0.7, alkaline phosphatase 77, AST 18, ALT 6. Troponin less than 0.02. WBC is 12.6, hemoglobin 10.7, hematocrit 33.7, platelet count 359,000. Urinalysis, hazy, 2 + ketones, leukocyte esterase 2 +, RBC 10, WBCs 62 per high-powered field. Lactic acid venous 1.5.   ASSESSMENT AND PLAN: A 69 year old Caucasian male, recent discharge from [facility] following percutaneous nephrostomy tube placement for obstructing left nephrolithiasis, septic shock, presents with ongoing nausea, vomiting for the past few days, with associated dizziness and generalized weakness, was sent here from his urologist's office and admitted because of dehydration and systemic inflammatory response syndrome and possible urinary tract infection.  1.  Dehydration secondary to nausea and vomiting with poor oral intake for the past few days.  2.  Systemic inflammatory response syndrome.   PLAN: Admit to medical floor for IV fluids, follow up urine output,  vitals, antiemetics, and follow up laboratories.   3.  Likely urinary tract infection. The patient was started on IV Rocephin after obtaining blood and urine cultures per urology recommendation. Therefore continue IV Rocephin and follow blood and urine cultures.  4.  Left nephrolithiasis status post percutaneous nephrostomy tube placement under care of urology, stable. Continue care per urology, urology consultation requested.  5.  Chronic obstructive pulmonary disease, stable on home medications. Continue same. No acute problems at this time.  6.  Deep vein thrombosis prophylaxis with sequential compression dressings. No heparin now because of dark-colored urine per nephrostomy, not sure if any hematuria.  7.  Gastrointestinal prophylaxis with proton pump inhibitor.  8.  Tobacco usage. Counseled to quit. The patient not motivated to quit.   CODE STATUS: Full code.   TIME SPENT: 50 minutes.    ____________________________ Crissie FiguresEdavally N. Morning Halberg, MD enr:bu D: 08/19/2014 19:36:05 ET T: 08/19/2014 20:34:41 ET JOB#: [id number]  cc: Crissie FiguresEdavally N. Shunte Senseney, MD, <Dictator> Meindert A. Lacie ScottsNiemeyer, MD Claris GladdenAshley J. Brandon, MD Unknown CC Crissie FiguresEDAVALLY N Annaston Upham MD ELECTRONICALLY SIGNED 08/24/2014 8:51

## 2014-10-23 NOTE — Consult Note (Signed)
PATIENT NAME:  [name] [name], [name] MR#:  [id number] DATE OF BIRTH:  1945-10-30  DATE OF CONSULTATION:  08/03/2014  REFERRING PHYSICIAN:   CONSULTING PHYSICIAN:  Stann Mainlandavid P. Sampson GoonFitzgerald, MD  REFERRING PHYSICIAN:  Dr. Sherryll BurgerShah.   REASON FOR CONSULTATION: Sepsis.   HISTORY OF PRESENT ILLNESS: This is a pleasant 69 year old gentleman with a history of COPD as well as chronic nephrolithiasis and hydronephrosis. He was originally followed at [facility] where it was recommended he undergo nephrectomy, however he was seeking a second opinion. This morning he woke up with worsening flank pain, nausea, vomiting, lightheadedness, and dizziness. He was brought to the Emergency Room where he was found to be hypotensive and had an elevated white count. CT scan showed massive left hydronephrosis with a stone at the left UPJ. There was also evidence of fluid in the abdomen. Since admission the patient has had a stent placed, nephrostomy tube with 800 mL of dark urine obtained. He has been started on Zosyn. Clinically he remains stable and is off pressors.   PAST MEDICAL HISTORY:  1. COPD.   2. Nephrolithiasis.  3. Chronic right hydronephrosis.   PAST SURGICAL HISTORY: None.   SOCIAL HISTORY: Smokes half a pack a day. He is retired. He used to be a Location managermachine operator. No alcohol.   ALLERGIES: No known drug allergies.   MEDICATIONS AT HOME:  Include Advair.    ANTIBIOTICS SINCE ADMISSION: Include Zosyn.   FAMILY HISTORY: Noncontributory.   REVIEW OF SYSTEMS: 11 systems reviewed and negative except as per HPI.    PHYSICAL EXAMINATION:  VITAL SIGNS: Temperature 97.4, pulse 127, blood pressure was 60/40 by EMS and 167 in the ED, saturations are 100% on room air.  GENERAL: He is pleasant, awake, interactive. He is obese.  HEENT: Pupils are reactive. Extraocular movements are intact. Sclerae are anicteric. Oropharynx is clear. No thrush.  NECK: Supple.  HEART: Regular but tachycardic.  LUNGS: Clear.   ABDOMEN: Soft, mildly tender to palpation, distended somewhat.  EXTREMITIES: No clubbing, cyanosis, or edema.  NEUROLOGIC: He is alert and oriented x 3. Grossly nonfocal neurologic exam.   LABORATORY DATA: Blood cultures x 2 are pending and are no growth to date. White blood count on admission was 26,000 on February 10, hemoglobin 1  Renal function shows a creatinine of 1.37, BUN 21. LFTs are normal. CT abdomen and pelvis shows massive left hydronephrosis due to a large stone at the left UPJ with urine and hemorrhage identified in the left abdomen, as well as a small splenic infarct likely related to compression of splenic vasculature by the right kidney. Gallstone without evidence of cholecystitis, small nonobstructing right renal stone, small hiatal hernia.   IMPRESSION: A 69 year old gentleman admitted with urosepsis due to large obstructing stone and hydronephrosis. Cultures are pending from a urine sample after he underwent placement of nephrostomy tube on February 10. 800 mL of dark urine were obtained at the time.   RECOMMENDATIONS:  1.  Continue Zosyn.  2.  Follow up cultures.  3.  Will likely need at least a 21 day course of antibiotics based on culture results.  4.  Thank you for the consult. I will be glad to follow with you.      ____________________________ Stann Mainlandavid P. Sampson GoonFitzgerald, MD dpf:bu D: 08/03/2014 20:15:10 ET T: 08/03/2014 21:05:05 ET JOB#: [id number]  cc: Stann Mainlandavid P. Sampson GoonFitzgerald, MD, <Dictator> Saja Bartolini Sampson GoonFITZGERALD MD ELECTRONICALLY SIGNED 08/16/2014 21:26

## 2014-10-23 NOTE — Consult Note (Signed)
Chief Complaint:  Subjective/Chief Complaint flank/ abd pain improved, remains hypotensive (not on pressors), tachycardic.  in ICU.   VITAL SIGNS/ANCILLARY NOTES: **Vital Signs.:   11-Feb-16 10:00  Vital Signs Type Routine  Pulse Pulse 107  Respirations Respirations 7  Systolic BP Systolic BP 83  Diastolic BP (mmHg) Diastolic BP (mmHg) 54  Mean BP 63  Pulse Ox % Pulse Ox % 100  Pulse Ox Activity Level  At rest  Oxygen Delivery Room Air/ 21 %  *Intake and Output.:   Daily 11-Feb-16 07:00  Grand Totals Intake:  4940 Output:  1775    Net:  3165 24 Hr.:  3007  Oral Intake      In:  240  IV (Primary)      In:  1900  IV (Primary)      In:  2500  IV (Secondary)      In:  300  Urine ml     Out:  1625  Emesis ml     Out:  150  Length of Stay Totals Intake:  4940 Output:  1775    Net:  3165   Brief Assessment:  GEN no acute distress, obese   Respiratory normal resp effort  no use of accessory muscles   Gastrointestinal Mild left CVA tenderness, nephrostomy tube in place draining coke colored urine, no clots   Gastrointestinal details normal Soft  mild improving LLQ tenderness   EXTR negative cyanosis/clubbing   Lab Results: Hepatic:  11-Feb-16 03:11   Bilirubin, Total 0.9  Alkaline Phosphatase 58  SGPT (ALT)  < 6  SGOT (AST) 18  Total Protein, Serum  5.2  Albumin, Serum  1.9  Routine Micro:  10-Feb-16 10:50   Micro Text Report MISC AER/ANAEROBIC CULT.   COMMENT                   NO GROWTH IN 8-12 HOURS   ANTIBIOTIC                       Specimen Source LT NEPHROSTOMY TUBE  Culture Comment NO GROWTH IN 8-12 HOURS  Result(s) reported on 04 Aug 2014 at 11:08AM.  Routine Chem:  11-Feb-16 03:11   Hemoglobin A1c ([facility]) 5.0 (The American Diabetes Association recommends that a primary goal of therapy should be <7% and that physicians should reevaluate the treatment regimen in patients with HbA1c values consistently >8%.)  Glucose, Serum  116  BUN  27  Creatinine  (comp)  1.81  Sodium, Serum 138  Potassium, Serum 4.3  Chloride, Serum 107  CO2, Serum 23  Calcium (Total), Serum  8.4  Osmolality (calc) 282  eGFR (African American)  48  eGFR (Non-African American)  40 (eGFR values <4m/min/1.73 m2 may be an indication of chronic kidney disease (CKD). Calculated eGFR, using the MRDR Study equation, is useful in  patients with stable renal function. The eGFR calculation will not be reliable in acutely ill patients when serum creatinine is changing rapidly. It is not useful in patients on dialysis. The eGFR calculation may not be applicable to patients at the low and high extremes of body sizes, pregnant women, and vegetarians.)  Anion Gap 8  Magnesium, Serum 1.9 (1.8-2.4 THERAPEUTIC RANGE: 4-7 mg/dL TOXIC: > 10 mg/dL  -----------------------)  Routine UA:  11-Feb-16 12:13   Color (UA) Amber  Clarity (UA) Hazy  Glucose (UA) Negative  Bilirubin (UA) Negative  Ketones (UA) Negative  Specific Gravity (UA) 1.045  Blood (UA) Negative  pH (UA)  5.0  Protein (UA) 30 mg/dL  Nitrite (UA) Negative  Leukocyte Esterase (UA) 2+ (Result(s) reported on 04 Aug 2014 at 12:52PM.)  RBC (UA) 22 /HPF  WBC (UA) 24 /HPF  Bacteria (UA) TRACE  Epithelial Cells (UA) 1 /HPF  Amorphous Crystal (UA) PRESENT (Result(s) reported on 04 Aug 2014 at 12:52PM.)  Routine Coag:  11-Feb-16 03:11   Prothrombin  16.4 (11.4-15.0 NOTE: New Reference Range  07/22/14)  INR 1.3 (INR reference interval applies to patients on anticoagulant therapy. A single INR therapeutic range for coumarins is not optimal for all indications; however, the suggested range for most indications is 2.0 - 3.0. Exceptions to the INR Reference Range may include: Prosthetic heart valves, acute myocardial infarction, prevention of myocardial infarction, and combinations of aspirin and anticoagulant. The need for a higher or lower target INR must be assessed individually. Reference: The Pharmacology  and Management of the Vitamin K  antagonists: the seventh ACCP Conference on Antithrombotic and Thrombolytic Therapy. ZOXWR.6045 Sept:126 (3suppl): [id number]. A HCT value >55% may artifactually increase the PT.  In one study,  the increase was an average of 25%. Reference:  "Effect on Routine and Special Coagulation Testing Values of Citrate Anticoagulant Adjustment in Patients with High HCT Values." American Journal of Clinical Pathology 2006;126:400-405.)  Routine Hem:  11-Feb-16 03:11   WBC (CBC)  21.2  RBC (CBC)  3.59  Hemoglobin (CBC)  9.5  Hematocrit (CBC)  29.7  Platelet Count (CBC) 239  MCV 83  MCH 26.4  MCHC 32.0  RDW  17.1  Neutrophil % 87.1  Lymphocyte % 4.8  Monocyte % 7.9  Eosinophil % 0.0  Basophil % 0.2  Neutrophil #  18.5  Lymphocyte # 1.0  Monocyte #  1.7  Eosinophil # 0.0  Basophil # 0.0 (Result(s) reported on 04 Aug 2014 at 03:47AM.)   Assessment/Plan:  Assessment/Plan:  Assessment 69 yo M with chronically obstructed, severely hydronephrotic left kidney with presenting with left kidney rupture, urinary extravasation, and sepsis.  Patient is PPD #1 s/p left PCN placement.  He remains in ICU with improving leukocytosis (27 --> 21) but remains tachycardic, hypotensive but not requiring pressors.  His renal function is also declining (? secondary to hypoperfusion from hypotension).  No fevers and culture negative to date.   Plan -CT abd/ pelvis without contrast today assess for size/ location residual collection outside of kidney which may require percutaneous drainage tomorrow -Case discussed with IR (Dr. Kathlene Cote) -NPO at MN in case drain is required -continue to flush drain -supportive care per critical care/ internal medicine -follow renal function -repeat H/H this evening   Electronic Signatures: Sherlynn Stalls (MD)  (Signed 11-Feb-16 13:40)  Authored: Chief Complaint, VITAL SIGNS/ANCILLARY NOTES, Brief Assessment, Lab Results,  Assessment/Plan   Last Updated: 11-Feb-16 13:40 by Sherlynn Stalls (MD)

## 2014-10-23 NOTE — Consult Note (Signed)
   Comments   Family and pt now requesting hospice screen before biopsy results are received.  Spoke with urologist, Dr. Apolinar JunesBrandon, and she recommends changing nephrostomy tubes prior to discharge for comfort. order hospice screen.  Electronic Signatures: Kataleyah Carducci, Jaquelyn BitterStephen J (NP)  (Signed 11-Apr-16 14:31)  Authored: Palliative Care Phifer, Harriett SineNancy (MD)  (Signed 11-Apr-16 14:51)  Authored: Palliative Care   Last Updated: 11-Apr-16 14:51 by Phifer, Harriett SineNancy (MD)

## 2014-10-23 NOTE — Consult Note (Signed)
Urology Consultation Report for Consultation: Right UPJ Stone s/p Right PCN MD: Jonnie KindEdavally Reddy, MDMD: Marin OlpJay H. Tadeo Besecker, MD 69 y.o WM admitted 08/19/2014 after presenting to the [facility] ER with 7-10 days of N/V, generalized weakness/dizziness. 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  Pt was treated swith Zosyn, then transitioned to Cipro after the UC&S returned mixed.  The pt was transferred to a SNF for Rehab/PT on 08/09/2014.  The pt endorses ongoing difficulties with intermittent N/V while at the SNF, able to tolerate only small amounts of water.  Pt has not had a BM for 3 days (last BM was nL/formed), bt has been passing flatus.  Pt denies voiding difficulty, endorsing small volume voids q3-4 hrs.  Denies dysuria, gross hematuria, F/C.  Pt denies any prior h/o urolithiasis. the ER, the pt was tachycardic despite IV hydration, with a mild leukocystosis.  Urine and blood cultures were sent and the pt started on IV Ceftriaxone.  The pt was admitted for dehydration with SIRS/poss UTI.  The pt has remained afeb with stable vital signs (mild tachycardia at 92-103).  The voided urine output is not being measured.  The PCN drained 450 mL as of 7am with 150mL from the urinoma drain. as per the above H&P and the detailed H&P by Dr. Jonnie KindEdavally Reddy dated 08/19/2014 with the following additions: FH neg for Prostate Cancer AVSS w/97% RA SatWD, Obese WM in NADWarm/Dry, without lesions/rash about the Head/NeckNC/AT, EOMI, anictericno masses, no bruitsCTA, nL respiratory effortRRR without M/G/R, 2+ radial pulsesmild SP tenderness to palpation without guarding/rebound, no palpable masses/organomegaly; Left PCN in place draining clear amber urine; Left Percutaneous Urinoma drain in place draining dark red  fluid without clotssevere phimosis (unable to retract - pt endorses inability to retract the foreskin for 5 yrs); right testis atrophy (life-long, per pt) - NT, nL left testisdecreased tone, 1+ firm prostate (R>L) with nodularity or tenderness; empty rectal vaultno edemanon-focalA&O x4, pleasant/cooperative WBC 9.2k this AM (down form 12.6k in the ER); Cr 0.77 with Ca++ 9.12 yesterday  s/p Left PCN 08/03/2014 for Left UPJ Obstruction due to large left UPJ Calculus with Sepsisthe PCN is draining well and the pt remains afebrile with resolution of the mild leukocytosis on IV Ceftriaxone with stable VS on IV hydration s/p 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  Electronic Signatures: Marin OlpKim, Charita Lindenberger H (MD)  (Signed on 27-Feb-16 13:04)  Authored  Last Updated: 27-Feb-16 13:04 by Marin OlpKim, Evelynn Hench H (MD)

## 2014-10-23 NOTE — Consult Note (Addendum)
PATIENT NAME:  [name] [name], [name] [name] MR#:  [id number] DATE OF BIRTH:  05-26-1946  DATE OF CONSULTATION:  10/03/2014  REFERRING PHYSICIAN:  Dr. Sherryll Burger. CONSULTING PHYSICIAN:  Stann Mainland. Sampson Goon, MD  REASON FOR CONSULTATION: MRSA infection in nephrostomy tube and sputum.   HISTORY OF PRESENT ILLNESS: This is a 69 year old gentleman I have seen in the past who had an admission from February 10 to February 16 and then a repeat admission February 26 through March 3 with sepsis due to left nephrolithiasis. He underwent left percutaneous nephrostomy tube placement in February due to the hydronephrosis and a large obstructive stone. Cultures were negative. He was treated with IV antibiotics and then transitioned to oral ciprofloxacin. He was admitted again April 7 with foul, dark-colored urine and left-sided abdominal pain. He also was having subjective fevers and chills. He was admitted from Dr. Delana Meyer office. 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  Biopsy was done on that today. He has been treated with IV antibiotics and the culture from his nephrostomy tube is growing MRSA as well as Enterococcus. A sputum is also growing MRSA. Clinically, he has improved some.  We are consulted for further antibiotic management.   PAST MEDICAL HISTORY:  1.  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  2.  GERD.  3.  COPD.  4.  Tobacco abuse.   PAST SURGICAL HISTORY: Nephrostomy tube as above.   SOCIAL HISTORY: He smoked quite heavily, 2 packs a day, until February 2016. No alcohol. Lives with his family.   FAMILY HISTORY: Noncontributory.   ALLERGIES: No known drug allergies.  ANTIBIOTICS SINCE ADMISSION INCLUDE:  Vancomycin begun April 7 through April 10, Zosyn given April 6 through current. Other medications are reviewed in his MAR.    REVIEW OF SYSTEMS:  Eleven  systems reviewed and negative except as HPI.   PHYSICAL EXAMINATION:  VITAL SIGNS: Temperature 97.7, pulse 100, blood pressure 110/74, respirations 18, saturation 96% on room air.  GENERAL: He is chronically ill-appearing, lying in bed.  He is in no acute distress, but is somewhat lethargic.  HEENT: Pupils reactive. Sclerae anicteric.  OROPHARYNX: Clear, but mucous membranes are dry.  NECK: Supple.  HEART: Regular.  LUNGS: Coarse breath sounds bilaterally.  ABDOMEN: Soft, distended, obese. He has a drain in his left perinephric area. There are 2 drains, 1 with dark bloody fluid, the other with clearish urine. He is tender to palpation on the left as well as diffusely in his abdomen.  EXTREMITIES: 1+ edema bilaterally.  NEUROLOGIC: He is alert and oriented x 3. Grossly nonfocal neuro exam.   LABORATORY DATA: Blood cultures April 7 x 2:  No growth. Urine culture April 7:  Mixed bacterial organisms suggestive of contamination. Sputum culture April 7:  Light growth MRSA sensitive to Bactrim.  Nephrostomy drain culture mixed bacterial organisms suggestive of contamination April 8. Drainage tube April 8:  Culture is growing moderate MRSA and heavy growth Enterococcus and being held for anaerobes. The MRSA was sensitive to Bactrim. The enterococcus was sensitive to ampicillin. White blood count April 7 was 16.6, down to 13.9 on April 9.  Hemoglobin 9.8, platelets 291,000. Renal function shows a creatinine of 0.55 on April 11. LFTs show quite low albumin at 1.7, alkaline phosphatase 188, AST 49, ALT 28. Troponin was negative. Urinalysis April 7 showed 16 to 30 white cells.   IMAGING:  CT of the abdomen and pelvis April 7 showed extensive metastases in the liver, left adrenal gland, retroperitoneal lymph nodes, lymph nodes in the porta hepatis, and possible tumor thrombus involving the left common iliac vein extending into the bilateral external iliac veins. There is marked deformity of the left kidney with  surrounding left perinephric fluid and air with percutaneous drain in place. The left kidney is smaller compared to pre-drainage CT of 08/03/2014. CT of the chest done April 11 showed interval progression of widespread hepatic metastatic disease. Left adrenal metastases. Widespread pulmonary metastatic disease progressive from February.  No obvious primary malignancy identified in the chest. Enlarging bilateral pleural effusions.   IMPRESSION: A 69 year old gentleman admitted with subjective fevers, elevated white count, and cloudy drainage from his nephrostomy. He has 2 drains in place. CT imaging has shown extensive metastatic disease of unknown primary and he has undergone a liver biopsy February 11 with the findings pending.  Culture from the nephrostomy tube is growing methicillin resistant Staphylococcus aureus as well as Enterococcus. Sputum cultures were growing methicillin resistant Staphylococcus aureus, but there is no real evidence of consolidation.   RECOMMENDATIONS:  1.  I would suggest a course of oral antibiotics to target the methicillin resistant Staphylococcus aureus, Enterococcus, and likely anaerobes in the nephrostomy tube. I would suggest switching him to Bactrim double strength twice a day as well Augmentin 875 twice a day. This will cover the methicillin resistant Staph aureus, the enterococcus and anaerobes.  2.  He is considering hospice. I think that he will need at least a 21 day course of these antibiotics for the infection in the nephrostomy site.  3.  Thank you for the consult. I will be glad to follow with you.   ____________________________ Stann Mainlandavid P. Sampson GoonFitzgerald, MD dpf:sp D: 10/03/2014 16:04:28 ET T: 10/03/2014 16:31:36 ET JOB#: [id number]  cc: Stann Mainlandavid P. Sampson GoonFitzgerald, MD, <Dictator> Erina Hamme Sampson GoonFITZGERALD MD ELECTRONICALLY SIGNED 10/25/2014 10:17

## 2014-10-23 NOTE — Consult Note (Signed)
ONCOLOGY followup note -  HPI: resting in bed. Still very weak but states that he feels slightly better overall. No pain issues.  no fevers. Denies obvious bleeding issues.More alert today and oriented, in no acute distress           vitals - 98, 100, 20, 110/68, 96% on room air.           lungs - b/l good BS           abd - nontenderWBC 13900, ANC [id number], hemoglobin 9.8, platelets 291, creatinine 0.71, calcium 2710.330.  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 The patient's general condition otherwise has been declining lately and he is getting weaker and resting most of the time in bed, (current performance status is ECOG 3 to 4). CT scan of the abdomen and pelvis done reveals extensive liver metastasis, metastatic adenopathy, adrenal lesion and retroperitoneal mass around the left kidney. Findings raise suspicion for stage 4 metastatic malignancy with possible primaries including kidney cancer versus other primary. Hospitalist has already requested for liver biopsy to be done on Monday. CT scan of the chest to see if there is any obvious lung metastasis or lung primary has been requested. Patient also diagnosed with sepsis upon admission, overall more alert today and he feels slightly better, continue ongoing supportive treatment with empiric antibiotics, steroids and other supportive therapy. The patient and family present (wife and daughter) are aware about CT scan findings,  that he has radiological evidence of stage 4 malignancy, which would be incurable and overall prognosis is poor especially since his performance status is declining. In addition, liver lesion seems to have rapidly developed over the last 2 months since they were not reported on CT scans done in February, indicating that this is clinically very aggressive tumor. At this time, they do want to try and get  biopsy and get tissue diagnosis if possible with understanding that his overall condition might be too weak to even consider any cancer treatment. Patient is DO NOT RESUSCITATE status. Will continue to follow.    Electronic Signatures: Izola PricePandit, Delon Revelo Raj (MD)  (Signed on 09-Apr-16 11:23)  Authored  Last Updated: 09-Apr-16 11:23 by Izola PricePandit, Julaine Zimny Raj (MD)

## 2014-10-23 NOTE — Consult Note (Signed)
Chief Complaint:  Subjective/Chief Complaint Urology F/U  Pt without new complaints.  Persistent nausea without emesis.  Bladder Scan PVR 32m this AM, per nursing.   VITAL SIGNS/ANCILLARY NOTES: **Vital Signs.:   28-Feb-16 04:39  Vital Signs Type Q 4hr  Temperature Temperature (F) 98.1  Celsius 36.7  Temperature Source oral  Pulse Pulse 96  Respirations Respirations 18  Systolic BP Systolic BP 1536 Diastolic BP (mmHg) Diastolic BP (mmHg) 70  Mean BP 81  Pulse Ox % Pulse Ox % 97  Pulse Ox Activity Level  At rest  Oxygen Delivery Room Air/ 21 %    08:10  Vital Signs Type Q 4hr  Temperature Temperature (F) 98  Celsius 36.6  Temperature Source oral  Pulse Pulse 96  Respirations Respirations 18  Systolic BP Systolic BP 1644 Diastolic BP (mmHg) Diastolic BP (mmHg) 68  Mean BP 84  Pulse Ox % Pulse Ox % 95  Pulse Ox Activity Level  At rest  Oxygen Delivery Room Air/ 21 %  *Intake and Output.:   Daily 28-Feb-16 07:00  Grand Totals Intake:  3560 Output:  1935    Net:  1625 24 Hr.:  1625  Oral Intake      In:  1845  IV (Primary)      In:  1715  Urine ml     Out:  1080  Other Output ml     Out:  205  Other Output ml     Out:  650  Length of Stay Totals Intake:  4385 Output:  2835    Net:  1550    Shift 15:00  Grand Totals Intake:  412 Output:  325    Net:  87 24 Hr.:  87  Oral Intake      In:  120  IV (Primary)      In:  292  Urine ml     Out:  125  Other Output ml     Out:  50  Other Output ml     Out:  150  Length of Stay Totals Intake:  4797 Output:  3160    Net:  1637   Brief Assessment:  GEN well developed, no acute distress, obese   Respiratory normal resp effort   Lab Results: Routine Chem:  28-Feb-16 05:58   Glucose, Serum 80  BUN 8  Creatinine (comp)  0.51  Sodium, Serum 141  Potassium, Serum  3.2  Chloride, Serum 106  CO2, Serum 25  Calcium (Total), Serum 8.6  Anion Gap 10  Osmolality (calc) 279  eGFR (African American) >60  eGFR  (Non-African American) >60 (eGFR values <613mmin/1.73 m2 may be an indication of chronic kidney disease (CKD). Calculated eGFR, using the MRDR Study equation, is useful in  patients with stable renal function. The eGFR calculation will not be reliable in acutely ill patients when serum creatinine is changing rapidly. It is not useful in patients on dialysis. The eGFR calculation may not be applicable to patients at the low and high extremes of body sizes, pregnant women, and vegetarians.)  Routine Hem:  28-Feb-16 05:58   WBC (CBC) 8.8  RBC (CBC)  3.53  Hemoglobin (CBC)  9.7  Hematocrit (CBC)  29.5  Platelet Count (CBC) 305  MCV 84  MCH 27.6  MCHC 33.0  RDW  18.6  Neutrophil % 80.6  Lymphocyte % 8.8  Monocyte % 8.6  Eosinophil % 1.0  Basophil % 1.0  Neutrophil #  7.1  Lymphocyte #  0.8  Monocyte # 0.8  Eosinophil # 0.1  Basophil # 0.1 (Result(s) reported on 21 Aug 2014 at 06:37AM.)   Assessment/Plan:  Invasive Device Daily Assessment of Necessity:  Does the patient currently have any of the following indwelling devices? none   Assessment/Plan:  Assessment 1. 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 BPH -voids 100-17m at a time with BS PVR 035m- pt is not bothered by his voiding pattern  4. Persistent nausea, but without emesis yesterday.  Still anorexic.   Plan No new urology recommendations. Dr. BrErlene Quanill f/u in the AM.   Electronic Signatures: KiDarcella CheshireMD)  (Signed [id number])  Authored: Chief Complaint, VITAL SIGNS/ANCILLARY NOTES, Brief Assessment, Lab Results, Assessment/Plan   Last Updated: 28-Feb-16 11:02 by KiDarcella CheshireMD)

## 2014-10-23 NOTE — Consult Note (Signed)
ONCOLOGY followup -  HPI: still very weak, family at bedside (wife, children) states that he is constantly resting or sleeping. Oral intake minimal.   no fevers. Denies pain issues.resting with eyes closed but arousable, weak, in no acute distress           vitals - afebrile, stable           lungs - b/l good BS           abd - nontenderCr 0.62. Recent WBC 13900, ANC [id number], hemoglobin 9.8, platelets 291, calcium 10.0.  CT scan of the chest. IMPRESSION:  1. Interval progression of widespread hepatic metastatic disease. 2. Left adrenal metastasis. 3. Widespread pulmonary metastatic disease, progressive from February. No obvious primary malignancy identified within the chest. 4. Enlarging bilateral pleural effusions.  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 Patient has CT scan of the abdomen and pelvis done revealing extensive liver metastasis, metastatic adenopathy, adrenal lesion and retroperitoneal mass around the left kidney. CT scan of the chest also shows widespread lung metastatic disease. Findings are most consistent with stage 4 metastatic malignancy with possible primaries including kidney cancer versus other primary. Patient had liver biopsy done yesterday, report is pending. Performance status is extremely poor, ECOG 4. The patient and family present (wife and children) were again explained that he has radiological evidence of stage 4 malignancy, which would be incurable and overall prognosis is poor, and that his overall condition/performance status his very poor to consider any kind of cancer treatment. Patient and family state that they have discussed with palliative care, and plan at this time is for him to go home tomorrow with hospice, agree with this plan. They will be contacted with the final pathology report. Patient is DO NOT RESUSCITATE status.       Electronic Signatures: Izola PricePandit, Lesa Vandall Raj (MD)  (Signed on 12-Apr-16 17:48)  Authored  Last Updated: 12-Apr-16 17:48 by Izola PricePandit, Maxemiliano Riel Raj (MD)

## 2014-10-23 NOTE — Consult Note (Signed)
PATIENT NAME:  [name] [name], [name] [name] MR#:  [id number] DATE OF BIRTH:  01-Apr-1946  DATE OF CONSULTATION:  09/30/2014  REFERRING PHYSICIAN:  Enid Baas, MD  CONSULTING PHYSICIAN:  Johnna Bollier R. Sherrlyn Hock, MD  REASON FOR CONSULTATION: Liver metastasis.   HISTORY OF PRESENT ILLNESS: The patient is a 69 year old gentleman with past medical history significant for COPD, GERD, left nephrolithiasis requiring nephrostomy placement in February, further complicated by urinoma with perinephric tube placed, who has been admitted to hospital with persistent foul urine. The patient also has had markedly decreased energy levels for the past few weeks to months. Per discussion with the patient and his wife and daughter who are at bedside. They state that he has been resting most of the time. Oral intake has been borderline to poor. He denies any significant unintentional weight loss. The patient has no prior diagnosis of malignancy. He initially had CT scan abdomen and pelvis on 08/03/2014, which reported massive left hydronephrosis due to a large stone. He then had another CT scan abdomen and pelvis on 08/23/2014, which reported decrease in the size of left retroperitoneal mass with difficulty separating left renal parenchyma from the left retroperitoneal process, 7 mm nodule in the right lower lobe. He had another CT of the abdomen and pelvis with contrast done last night, which is reporting extensive metastasis in the liver, left adrenal gland, retroperitoneal lymph nodes, porta hepatis lymph nodes and possible thrombus or tumor thrombus involving the left common iliac vein extending into the bilateral external iliac veins. Oncology has been consulted for possible metastatic malignancy. The patient denies any major pain issues at this time. He is weak and resting, but easily arousable and converses appropriately.   PAST MEDICAL AND SURGICAL HISTORY: As in history of present illness above.   FAMILY HISTORY: Remarkable  for heart disease.   SOCIAL HISTORY: Positive for smoking, denies alcohol usage.   ALLERGIES: No known drug allergies.   HOME MEDICATIONS: Advair 250/50 inhaler b.i.d., Tessalon 200 mg t.i.d. p.r.n. for cough, Ventolin b.i.d., ProAir 90 mcg 2 puffs every 4 hours p.r.n., Pepcid 20 mg daily,  ferrous sulfate 325 mg b.i.d., Pepcid 20 mg daily, senna S 2 tablets b.i.d. p.r.n. constipation, potassium 20 mEq daily.   REVIEW OF SYSTEMS: CONSTITUTIONAL: Severe weakness, resting, most of the time. No fevers or chills.  HEENT: Denies any headaches or dizziness currently. No epistaxis, ear or jaw pain.  CARDIAC: Denies any angina, palpitation, orthopnea.  LUNGS: Has cough, dyspnea. No hemoptysis or chest pain.  GASTROINTESTINAL: No nausea, vomiting, or diarrhea. No bright red blood in stools or melena.  GENITOURINARY: No dysuria or hematuria.  MUSCULOSKELETAL: Denies any new bone pains.  EXTREMITIES: Has some swelling, no new pains.  NEUROLOGIC: No new focal weakness, seizures or loss of consciousness.  ENDOCRINE: No polyuria or polydipsia.   PHYSICAL EXAMINATION: GENERAL: Patient is moderately built and nourished individual, resting in bed, weak, and tired-looking, but otherwise arousable and oriented x3 and converses appropriately. No acute distress.  VITAL SIGNS: Temperature 97.6, pulse 111, respiratory rate 22, blood pressure 93/61, saturation 97% on room air.  HEENT: Normocephalic, atraumatic, extraocular movements intact, sclerae anicteric.  NECK: Negative for lymphadenopathy.  CARDIOVASCULAR:  S1, S2, regular.  LUNGS: Show diminished breath sounds at bases, no rhonchi noted.  ABDOMEN: Soft, liver is enlarged but nontender, no other masses palpable clinically.  EXTREMITIES: Shows mild edema, no cyanosis.  NEUROLOGIC: Cranial nerves are intact, moves all extremities spontaneously.   LABORATORY RESULTS: WBC 12,100, hemoglobin 9.1, platelets 315,000,  ANC 10,300, creatinine 0.79, calcium 9.6,  urine culture no growth in 8 to 12 hours.   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 The patient's general condition otherwise has been declining lately and he is getting weaker and resting most of the time in bed, (current performance status is ECOG 3 to 4). CT scan of the abdomen and pelvis done last night reveals extensive liver metastasis, metastatic adenopathy, adrenal lesion and retroperitoneal mass around the left kidney. Findings raise suspicion for stage 4 metastatic malignancy with possible primaries including kidney cancer versus other primary. Hospitalist has already requested for liver biopsy to be done on Monday. We will also get CT scan of the chest to see if there is any obvious lung metastasis or lung primary. The patient and family present (wife and daughter) have been explained about CT scan findings, having independently reviewed films and also discussed with them and explained that he has radiological evidence of stage 4 malignancy, which would be incurable and overall prognosis is poor, especially since his performance status is declining. In addition, liver lesion seems to have rapidly developed over the last 2 months since they were not reported on CT scans done in February, indicating that this is clinically very aggressive tumor. At this time, they do want to try and get biopsy and get tissue diagnosis if possible with understanding that his overall condition might be too weak to even consider any cancer treatment. I have also discussed CODE STATUS, patient does not want mechanical ventilation or resuscitation in case of cardiopulmonary failure/arrest. We will order DO NOT RESUSCITATE status accordingly. Over the weekend, if his condition rapidly declines, they  state that they would likely pursue comfort care and understand that his overall prognosis is extremely poor. We will continue to follow.   Thank you for the referral. Please feel free to contact me for additional questions.    ____________________________ Maren ReamerSandeep R. Sherrlyn HockPandit, MD srp:nt D: 09/30/2014 18:54:43 ET T: 09/30/2014 19:57:33 ET JOB#: [id number]  cc: Secily Walthour R. Sherrlyn HockPandit, MD, <Dictator> Wille CelesteSANDEEP R Jaevon Paras MD ELECTRONICALLY SIGNED 10/01/2014 9:57

## 2014-10-23 NOTE — Consult Note (Signed)
Chief Complaint:  Subjective/Chief Complaint s/p liver biopsy this AM, results pending.  Nephrostomy/ perinephric drain NOT exchanged.  palliative care consult today, patient desires ultimately home with hospice.   VITAL SIGNS/ANCILLARY NOTES: **Vital Signs.:   11-Apr-16 13:06  Vital Signs Type Routine  Temperature Temperature (F) 97.7  Celsius 36.5  Temperature Source oral  Pulse Pulse 108  Respirations Respirations 18  Systolic BP Systolic BP 110  Diastolic BP (mmHg) Diastolic BP (mmHg) 74  Mean BP 86  Pulse Ox % Pulse Ox % 96  Pulse Ox Activity Level  At rest  Oxygen Delivery Room Air/ 21 %  Telemetry pattern Cardiac Rhythm Normal sinus rhythm; pattern reported by Telemetry Clerk; HR 74  *Intake and Output.:   Daily 11-Apr-16 07:00  Grand Totals Intake:  3021.6 Output:  790    Net:  2231.6 24 Hr.:  2231.6  IV (Primary)      In:  2211.07  IV (Secondary)      In:  668  IV (Secondary)      In:  142.53  Urine ml     Out:  625  Other Output ml     Out:  15  Other Output ml     Out:  150  Length of Stay Totals Intake:  11014.6 Output:  4135    Net:  6879.6   Brief Assessment:  GEN no acute distress, obese, critically ill appearing, pale   Cardiac + LE edema   Respiratory normal resp effort  no use of accessory muscles  wheezing   Gastrointestinal Normal  Left nephrostomy draining clear yellow urine, perinephric drain blood tinged   Gastrointestinal details normal Soft  Nontender  No gaurding  No rigidity   Lab Results: Routine Chem:  11-Apr-16 04:57   Creatinine (comp)  0.55 (0.61-1.24 NOTE: New Reference Range  08/30/14)  eGFR (African American) >60  eGFR (Non-African American) >60 (eGFR values <60mL/min/1.73 m2 may be an indication of chronic kidney disease (CKD). Calculated eGFR is useful in patients with stable renal function. The eGFR calculation will not be reliable in acutely ill patients when serum creatinine is changing rapidly. It is not useful  in patients on dialysis. The eGFR calculation may not be applicable to patients at the low and high extremes of body sizes, pregnant women, and vegetarians.)   Assessment/Plan:  Assessment/Plan:  Assessment 1) Ruptured XGP kidney s/p nephrostomy and perinephric drain 2) Advanced metastatic diease of unknow origin, ? renal origin although mass not apprecitated on previous imaging (severe distortion from chronic obstruction), s/p liver biopsy today 10/03/14, results pending 3) Sepsis, probably urinary cause on Vanc/ Zosyn 4) Failure to thrive   Plan 1) Recommend drains exchange in setting of possible infection in IR/ comfort prior to discharge 2) Agree with IV abx, adjust as needed based on culture results 3) Finding and implications reviewed with family in detail extensively, patient may no longer be surgical candidate pending above work up 4) Agree with palliative care/ oncology consults 5) No current plans for urgent surgical intervention 6) Supportivie care   Electronic Signatures: Brandon, Ashley J (MD)  (Signed 11-Apr-16 14:29)  Authored: Chief Complaint, VITAL SIGNS/ANCILLARY NOTES, Brief Assessment, Lab Results, Assessment/Plan   Last Updated: 11-Apr-16 14:29 by Brandon, Ashley J (MD) 

## 2014-10-23 NOTE — H&P (Signed)
PATIENT NAME:  [name] [name], RATHBONE MR#:  [id number] DATE OF BIRTH:  February 18, 1946  DATE OF ADMISSION:  08/03/2014  PRIMARY CARE PHYSICIAN:  Meindert A. Lacie Scotts, MD  REQUESTING PHYSICIAN:  Cecille Amsterdam. Mayford Knife, MD  CHIEF COMPLAINT:  Abdominal pain.  HISTORY OF PRESENT ILLNESS: The patient is a 69 year old male with known history of COPD with active ongoing smoking. He is being admitted for severe sepsis with septic shock likely secondary to urinary source. The patient has been having issues with kidney stone since November 2015 when he saw someone at [facility], who recommended 2 options, 1 being close monitoring; the second, getting the kidney stone removal. He was not comfortable at that point with the opinion, so was seeking for second opinion and has a followup appointment coming up next Monday at [facility].  Since last November, he has been having intermittent pain, but has been getting worse over the last 1 week. Yesterday evening, he almost felt like he was going to die. He kept going until early morning around 3:00-4:00 a.m. when he tried to get up to go to bathroom, but felt very dizzy and fell on the floor twice. EMS was called and he was brought down to the Emergency Department. EMS reported his blood pressure of 60/40. The patient was complaining of 10/10 pain when he was here. Underwent CT scan of the abdomen and pelvis in the Emergency Department which showed massive left hydronephrosis due to a large stone the left UPJ with urine and hemorrhage identified in the left abdomen. He is being admitted for further evaluation and management. Urology, Dr. Vanna Scotland, did see the patient in the Emergency Department who recommended percutaneous nephrostomy tube placement and treatment for sepsis.   PAST MEDICAL HISTORY:  1.  COPD.  2.  Kidney stone.   ALLERGIES: No known drug allergies.   MEDICATIONS AT HOME:  Advair 100/50, 1 puff inhaled twice a day.   SOCIAL HISTORY: Smokes half  pack of cigarettes daily for the last 14 years. No alcohol. He is retired, used to work as a Location manager.   FAMILY HISTORY: Brother with a history of MI.  REVIEW OF SYSTEMS:  CONSTITUTIONAL: No fever. Positive fatigue and weakness.  EYES: No blurred or double vision.  EARS, NOSE, THROAT: No tinnitus or ear pain.  RESPIRATORY: No cough, wheezing, hemoptysis.  CARDIOVASCULAR: No chest pain, orthopnea, edema.  GASTROINTESTINAL: No nausea, vomiting, abdominal pain.  GENITOURINARY: Positive for right flank pain, dysuria, and history of kidney stone.  ENDOCRINE: No polyuria or nocturia.  HEMATOLOGY: No anemia or easy bruising.  SKIN: No rash or lesion.  MUSCULOSKELETAL: No arthritis or muscle cramp.  NEUROLOGIC: No tingling, numbness. Positive for weakness and dizziness. Also presyncope.  PSYCHIATRY: No history of anxiety or depression.   PHYSICAL EXAMINATION: VITAL SIGNS: Temperature 97.4, heart rate 127 per minute, respirations 20 per minute, blood pressure was 60/40 by EMS and 100/67 in the Emergency Department. He was saturating 100% on room air.  GENERAL: The patient is a 69 year old morbid obesity male with a BMI of 41.9, lying in the bed, critically sick.  HEENT: Head atraumatic, normocephalic. Oropharynx and nasopharynx clear. Eye, pupils equal, round, reactive to light and accommodation. No scleral icterus. Extraocular muscles intact.  NECK: Supple. No jugular venous distention. No thyroid enlargement or tenderness.  LUNGS: Clear to auscultation bilaterally. No wheezing, rales, rhonchi, or crepitation.  CARDIOVASCULAR: S1, S2 normal. Tachycardic. No murmurs, rubs, or gallop.  ABDOMEN: Soft. He does have left flank tenderness,  also with some guarding and rigidity.  Bowel sounds active.  NEUROLOGIC: Cranial nerves II-XII intact. Muscle strength 5/5 in all extremities. Sensation intact.  PSYCHIATRIC: The patient is alert and oriented x 3.  SKIN: No obvious rash, lesion, ulcer.   MUSCULOSKELETAL: No joint effusion or tenderness.   LABORATORY DATA: Normal BMP, except BUN of 21, creatinine 1.37, blood sugar 152. Normal liver function tests. Normal CBC except white count of 26.7.   IMAGING:  CT scan of the abdomen and pelvis in the Emergency Department showed massive left hydronephrosis due to a large stone at the left UPJ with urine and hemorrhage within the left abdomen. Small splenic infarct, likely due to compression of the splenic vasculature by the right kidney. Small hiatal hernia. Small, nonobstructing right renal stones. Gallstone without evidence of cholecystitis.   Chest x-ray showed centrally obstructing right suprahilar mass with partial right upper lobe collapse.   IMPRESSION AND PLAN: 1.  Severe sepsis with septic shock. We will admit him to critical care unit. Start him on intravenous Zosyn, obtain 2 sets of blood cultures along with urine culture. We will consult infectious disease, urology, and intensivist. Case was discussed with Dr. Belia HemanKasa, Dr. Vanna ScotlandAshley Brandon, who has already seen the patient in the Emergency Department. The patient is going straight to interventional radiology for percutaneous nephrostomy tube and her interventional radiology. I also discussed with Dr. Sampson GoonFitzgerald. Will use pressors as needed. Will put him on  sepsis protocol.  2.  Massive left-sided hydronephrosis with large stone, likely infected, due to same. Getting  percutaneous nephrostomy tube under interventional radiology at this time.  3.  Chronic obstructive pulmonary disease with ongoing smoking. Will continue Advair, monitor his breathing.  4.  Lung mass, incidental finding, with history of active smoking. Will go ahead and get the CT scan of chest with contrast for further evaluation of this mass. 5.  Tobacco abuse. He was counseled for about 3 minutes, not willing to quit yet. Denies any need for nicotine replacement therapy while in the hospital.   CODE STATUS: Full code.    TOTAL TIME TAKING CARE OF THIS PATIENT (CRITICAL CARE): 55 minutes. He remains at high risk for multiorgan failure and cardiorespiratory failure, including death.   ____________________________ Ellamae SiaVipul S. Sherryll BurgerShah, MD vss:LT D: 08/03/2014 16:13:32 ET T: 08/03/2014 17:37:08 ET JOB#: [id number]  cc: Athira Janowicz S. Sherryll BurgerShah, MD, <Dictator> Meindert A. Lacie ScottsNiemeyer, MD Claris GladdenAshley J. Brandon, MD Stann Mainlandavid P. Sampson GoonFitzgerald, MD Ellamae SiaVIPUL S [address] [address] [facility] LL[facility] MD ELECTRONICALLY SIGNED 08/04/2014 11:44

## 2014-10-23 NOTE — Discharge Summary (Signed)
PATIENT NAME:  [name] [name], [name] [name] MR#:  [id number] DATE OF BIRTH:  01/16/1946  DATE OF ADMISSION:  09/29/2014 DATE OF DISCHARGE:  10/05/2014  DISCHARGE DIAGNOSES:  1. Methicillin-resistant Staphylococcus aureus sepsis.  2. Progressive stage IV metastatic cancer.   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    CONSULTATIONS:  1. Palliative care, Dr. Izora Gala Phifer. 2. Oncology, Dr. Leia Alf.  3. Infectious disease, Dr. Adrian Prows.   PROCEDURES AND RADIOLOGY: Chest x-ray on April 7 showed no active disease.   CT scan of the abdomen and pelvis with contrast on April 7 showed findings consistent with extensive metastasis in the liver,  left adrenal gland, retroperitoneal lymph nodes, with lymph nodes in the porta hepatis and possibly thrombus or tumor thrombus involving the left common iliac vein extending into the bilateral external iliac veins. 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   CT scan of the chest without contrast on April 11 showed interval progression of the widespread hepatic metastatic disease. Left adrenal metastasis. Widespread pulmonary metastatic disease progressive from February. Enlarging bilateral pleural effusion.   Ultrasound-guided liver biopsy without any immediate complications.   MAJOR LABORATORY PANEL: Urinalysis on admission showed rare bacteria, 16 to 30 WBCs, 3+ leukocyte esterase, and WBC in clumps.   Urine culture was contaminated.   Sputum culture grew light growth of MRSA.   Blood cultures x2 were negative on April 7.   The drainage tube culture grew moderate growth of MRSA and sputum culture grew light growth of MRSA on April 8.   HISTORY AND SHORT HOSPITAL COURSE: The patient is a 69 year old male with above-mentioned medical problems, who  was admitted for foul urine and was found to have sepsis present on admission from urinary source. Please see Dr. Ardith Dark dictated history and physical for further details. The patient underwent CT scan of the abdomen and pelvis, which was concerning for metastatic disease with underlying renal cell carcinoma as primary. Oncology consultation was obtained with Dr. Leia Alf, who recommended a palliative care consultation, which was obtained with Dr. Izora Gala Phifer, who met with the patient and family, and the decision was made to keep him comfortable in hospice care at home. Infectious disease consultation was obtained with Dr. Adrian Prows, who recommended changing the patient's antibiotic from IV to oral Bactrim and Augmentin for 21 days to cover infectious source. Urology consultation was also obtained with Dr. Hollice Espy, who recommended changing the patient's tube in the setting of possible infection. She did not recommend any surgical intervention, at this point, and likely very poor prognosis.   The patient was set up for hospice services at home and was discharged home in fair condition.   VITAL SIGNS: On the date of discharge, his vital signs were as follows: Temperature 98.2, heart rate 120 per minute, respirations 20 per minute, blood pressure 130/81. He was saturating 94% on room air.   PERTINENT PHYSICAL EXAMINATION ON THE DATE OF DISCHARGE:  CARDIOVASCULAR: S1, S2 normal. No murmur, rubs, or gallop.  LUNGS: Clear to auscultation bilaterally. No wheezing, rales, rhonchi, or crepitation.  ABDOMEN: Soft, hypoactive bowel sounds, otherwise benign.  GENITOURINARY: Has a left nephrostomy tube.  NEUROLOGICAL: Nonfocal examination. PSYCHIATRIC: The patient was lethargic, but oriented.   All other physical examination remained at baseline.   DISCHARGE MEDICATIONS:   Medication Instructions  advair diskus 250 mcg-50 mcg inhalation powder  1 puff(s) inhaled 2 times a day    proair hfa cfc free 90 mcg/inh inhalation aerosol  2 puff(s) inhaled every 4 hours, As Needed - for Shortness of Breath   senna s 50 mg-8.6 mg oral tablet  2 tab(s) orally 2 times a day, As Needed - for Constipation   oxycodone 5 mg/5 ml oral solution  15 milliliter(s) orally every 4 hours x 30 days, As Needed, moderate pain (4-6/10) , As needed, moderate pain (4-6/10)   amoxicillin-clavulanate  875 milligram(s) orally 2 times a day x 21 days   sulfamethoxazole-trimethoprim  1 tab(s) orally every 12 hours x 21 days    DISCHARGE DIET: Regular.   DISCHARGE ACTIVITY: As tolerated.   DISCHARGE INSTRUCTIONS AND FOLLOW-UP: The patient was instructed to follow up with his primary care physician, Dr. Leia Alf, in 1 to 2 weeks. He will be followed by hospice at home.   CODE STATUS: DNR.   His overall prognosis is very poor.   Total time spent: 45 mins ____________________________ Marelin Tat S. Manuella Ghazi, MD vss:JT D: 10/06/2014 11:20:03 ET T: 10/06/2014 11:52:40 ET JOB#: [id number]  cc: Baer Hinton S. Manuella Ghazi, MD, <Dictator> Sandeep R. Ma Hillock, MD Efraim Kaufmann, MD Sherlynn Stalls, MD Cheral Marker. Ola Spurr, MD Sidney MD ELECTRONICALLY SIGNED 10/06/2014 14:34

## 2014-10-23 NOTE — Discharge Summary (Signed)
PATIENT NAME:  [name] [name], [name] [name] MR#:  [id number] DATE OF BIRTH:  May 21, 1946  DATE OF ADMISSION:  08/03/2014 DATE OF DISCHARGE:  08/09/2014   ADMITTING DIAGNOSES:  1.  Septic shock.  2.  Massive left-sided hydronephrosis with a large stone causing obstruction.  3.  Chronic obstructive pulmonary disease with ongoing smoking.  4.  Lung mass.  5.  Tobacco abuse.   DISCHARGE DIAGNOSES:  1.  Severe sepsis with septic shock improved. Cultures were negative.  2.  Massive left-sided hydronephrosis with a large stone causing obstruction.  3.  Chronic obstructive pulmonary disease.  4.  Left lower lung mass versus nodule. Need repeat CT of the chest in 3 to 6 months.  5.  Tobacco abuse, recommended nicotine patch.   PROCEDURES: Percutaneous nephrostomy tube placement by interventional radiology.  This is done on 08/03/2014.  On 02/12, the patient had a CT-guided perinephric drain insertion.   CONSULTATIONS: Critical care consult neurology, Dr. Apolinar Junes. Infectious disease, Dr. Sampson Goon.     BRIEF HISTORY AND HOSPITAL COURSE: The patient is a 69 year old male who came into the ED with a chief complaint of abdominal pain. The patient has been having issues with her kidney stones since November 2015 and seen by [facility] in the past.  The patient was brought into the ED with severe abdominal pain on 08/03/2014.  Please review history and physical for details. CAT scan of the abdomen and pelvis has revealed massive left-sided hydronephrosis with large stone in the left UPJ with urine and hemorrhage identified in the left abdomen. ED physician consulted urology, Dr. Apolinar Junes, who has recommended percutaneous nephrostomy tube placement immediately by interventional radiology.  The patient was in severe septic shock at the time of admission.  He was admitted to Critical Care Unit and he was started on IV Zosyn after obtaining 2 sets of blood cultures and urine culture.  ID, urology and critical care  consults were placed.  The patient was given IV fluids and IV pressors as needed and antibiotics were continued. Urology is recommending outpatient nephrectomy eventually, once the patient is clinically stable. Regarding this, the patient is to follow up with urology.   HOSPITAL COURSE: Eventually the patient was seen by urology, Dr. Delana Meyer group, and had a percutaneous CT-guided perinephric drain insertion on 02/12.  Blood cultures were negative and urine cultures also revealed mixed growth which is contamination. ID has recommended to continue ciprofloxacin 500 mg p.o. b.i.d. for a total of 21 days and his broad-spectrum antibiotics, Zosyn was discontinued and patient was started on ciprofloxacin on 08/08/2014.  The patient's blood pressure and clinical situation has significantly improved.   For chronic obstructive pulmonary disease, the patient was counseled to quit smoking. He was given Advair and breathing treatments as needed basis.   Acute blood loss anemia, due to hemorrhage into the abdominal cavity, hemoglobin trended around 13.7 to 7.7.  The patient has received 1 unit of blood transfusion today.  We will continue iron supplements. The patient's hemoglobin needs to be repeated by primary care physician in the next 3 to 4 weeks.   Lung nodule was noticed and given the history of ongoing smoking, a CAT scan of the chest was done which has confirmed 7 mm right lower lobe lung nodule and the plan is to repeat the CAT scan of the chest in 3 to 6 months as recommended by radiology. The patient was counseled to quit smoking.   For deconditioning, physical therapy has evaluated the patient and have  recommended skilled nursing facility placement for rehabilitation. At this point, we are following up with the care manager and social worker regarding placement.  Bed is available and the patient will be transferred to a skilled nursing facility once insurance company gives approval.   CODE STATUS:  FULL CODE.   PHYSICAL EXAMINATION:  VITAL SIGNS: Temperature 98, pulse 98, respirations 18, blood pressure 124/70, pulse oximetry 98% on room air.  GENERAL APPEARANCE: Not in acute distress. Morbidly obese.  HEENT: Normocephalic, atraumatic. Pupils are equally reactive to light and accommodation. No scleral icterus. No conjunctival injection. No sinus tenderness.  NECK: Supple. No JVD. No thyromegaly. Range of motion is intact.  LUNGS: Clear to auscultation bilaterally.  No accessory muscle usage.    CARDIAC: S1, S2 normal. Regular rate and rhythm. No murmur.  GASTROINTESTINAL: Soft. Minimal tenderness is present in the suprapubic area and also in the drain area.  The drain is intact  with some serosanguineous fluid.  Nephrostomy tube is intact.  EXTREMITIES:  No cyanosis. No clubbing, no edema.  SKIN: No rashes. No ulcers. PSYCHIATRIC:   Awake, alert and oriented x 3.  NEUROLOGIC:  Follows verbal commands. Motor and sensory are grossly intact.  LABORATORY AND IMAGING STUDIES: CAT scan of the abdomen and pelvis without contrast on 08/04/2014 has revealed after insertion of the percutaneous drain a superior and medial dominant pocket of fluid at the level of the left kidney, appears decompressed.  The left kidney is nonfunctional.  The degree of  fluid extending into the pelvis appears stable with no evidence of increasing hemorrhage by imaging.   CAT scan of the chest with contrast on 08/04/2014, a 7 mm left lower lobe lung nodule. A follow-up chest CT in 3 to 6 months is recommended.  Trace left-sided pleural effusion, severely abnormal left kidney with  small volume of free fluid since yesterday.       On 08/08/2014, the patient's BMP is normal. Calcium is 8.1. CBC: WBC 7.7, hemoglobin 7.3, receiving 1 unit of blood transfusion, hematocrit 23.2, platelets are 270,000.  Blood cultures no growth.  Urine culture mixed bacteria suggestive of contamination.  Lactic acid on 08/03/2014 is 2.6.  CODE  STATUS: FULL CODE,  MEDICATIONS:  Advair 100-50 at 1 puff inhalation 2 times a day, Tylenol 325 mg 2 tablets p.o. every 4 hours as needed for mild pain or temperature greater than 100.4, oxycodone 15 mg 1 tablet p.o. every 4 to 6 hours as needed for moderate to severe pain, polyethylene glycol 17 grams p.o. once daily as needed for constipation, famotidine 20 mg 1 tablet p.o. once daily, ciprofloxacin 500 mg p.o. 2 every 12 hours for the next 20 days, iron sulfate 325 mg 1 tablet p.o. 2 times a day, Colace 100 mg p.o. 2 times a day while the patient is on iron supplements.   DIET: Regular, regular consistency.    The nephrostomy tube and drain care, per urology.  Follow up with primary care physician in 1 week, urology Dr. Apolinar Junes in 1 to 2 weeks, Dr. Sampson Goon in 2 to 4 weeks.  The patient is to follow up with urology for outpatient nephrectomy eventually.   ACTIVITY: As recommended by physical therapy as tolerated.   The diagnosis and plan of care was discussed in detail with the patient and family members in detail and all questions were answered.  They verbalized understanding of the plan. The patient will be transferred to a skilled nursing facility for rehabilitation soon after insurance approval.  TOTAL TIME SPENT ON THE DISCHARGE:  40 minutes.        ____________________________ [name] LabAruna Tremell Reimers, MD ag:DT D: 08/09/2014 14:49:00 ET T: 08/09/2014 16:22:27 ET JOB#: [id number]  cc: [name] LabAruna Kiasia Chou, MD, <Dictator> Stann Mainlandavid P. Sampson GoonFitzgerald, MD Claris GladdenAshley J. Brandon, MD Primary care physician    [name] LabARUNA Westin Knotts MD ELECTRONICALLY SIGNED 08/19/2014 14:46

## 2014-10-23 NOTE — Consult Note (Signed)
PATIENT NAME:  [name], [name] MR#:  [id number] DATE OF BIRTH:  [birth year]  DATE OF CONSULTATION:  08/03/2014  REFERRING PHYSICIAN:   CONSULTING PHYSICIAN:  Claris Gladden, MD  CONSULTING PHYSICIAN:  ER.    CONSULTANT:  Dr. Vanna Scotland, urology.    CHIEF COMPLAINT: Left flank pain.    HISTORY OF PRESENT ILLNESS:  This is a 69 year old male who presented to the ER this morning with significant hypotension with systolics in the 60s, tachycardia, afebrile, and severe left flank pain. The patient reports that he woke up around 4:00 a.m. feeling quite ill, dizzy, and nauseated. Upon getting out of bed he collapsed to the floor and his wife called 911. In the ER laboratory data revealed a significant leukocytosis of 26.7, a creatinine of 1.37, and a CT scan revealing a severely hydronephrotic left kidney presumably secondary to a large 3.1 cm left UPJ stone with evidence of extravasation of presumably urine and blood concerning for a rupture of the kidney. There was also noted to be a small splenic infarct as well. The patient denied a history of any trauma whatsoever to his left kidney.   The patient does report that he was seen back in January at [facility] by Dr. Brunilda Payor, at which time he underwent a CT scan with and without contrast which showed a severely hydronephrotic left kidney with the obstructing ureteral stone. The kidney appeared to have very little residual parenchyma and he was counseled to undergo a nephrectomy. He was also advised to lose weight prior to this procedure and has been actively dieting and has lost 50 pounds. He was taking a second opinion at [facility] which was scheduled for tomorrow.   With this additional history and previous imaging I discussed the case with Dr. Simonne Come from interventional radiology, who agreed that his clinical presentation was more consistent with sepsis and less likely to be hemorrhage, especially given his normal hematocrit. 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 The patient was taken emergently to interventional radiology for a left percutaneous nephrostomy tube, at which time 800 mL was drained from the kidney with a dark brown-reddish quality. This was sent off for analysis.   PAST MEDICAL HISTORY:   1. Obesity.  2. Nephrolithiasis.  3. GERD.    PAST SURGICAL HISTORY:  History of left shoulder surgery.   ALLERGIES: No known drug allergies.   FAMILY HISTORY: Noncontributory to current presentation.   REVIEW OF SYSTEMS: A 13 review of systems was performed and was otherwise negative other than as per HPI.   PHYSICAL EXAMINATION:   VITAL SIGNS: 97.5, blood pressure upon examination in the ER 95/61, pulse 115, respirations 24, 96% on room air.  GENERAL:  Moderate distress, uncomfortable, lying in stretcher in the ER this morning. Family including wife and children at bedside. Somewhat pale appearing RESPIRATIONS: No increased work of breathing or use of accessory muscles. No retractions.  CARDIOVASCULAR: No clubbing, cyanosis, or edema.  ABDOMEN: Obese. Tenderness over left hemiabdomen with some voluntary guarding. No rebound or rigidity. Left CVA tenderness present. No right CVA tenderness  GENITOURINARY: Normal phallus. No scrotal edema.  SKIN: Moist mucous membranes after receiving 3 liters of fluid. No rashes or bruises.  HEENT: Normocephalic, atraumatic.  NECK:  Trachea is midline. No masses.   LYMPHATIC: No axillary or inguinal palpable adenopathy.  PSYCHIATRIC: No  depression, normal affect.   NEUROLOGIC: Grossly intact. No focal deficits. Alert and oriented x 3.   PERTINENT LABORATORY DATA:  Has creatinine of 1.37, BUN 21, GFR 55. LFTs within normal limits. Albumin 2.7. CBC, WBC is 26.7, hemoglobin 13.7, hematocrit is 43.1, platelets are 401,000. Urine and blood  culture pending, no growth to date after 8 hours. Lactic acid 2.6. CT scan was reviewed personally by myself as well as with interventional radiology, also a previous scan was also reviewed and these are as per HPI.    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 Reviews of his imaging reveal extravasation of fluid around his blown out kidney which is mostly consistent with urinoma.  He is now status post left percutaneous nephrostomy tube and has been admitted to the ICU for supportive care as well as antibiotics. He has been started on Zosyn and vancomycin for broad-spectrum coverage.   RECOMMENDATIONS:  1.  Insure the nephrostomy tube remains patent, flush as needed and monitor output.  2.  Will follow up urine and blood culture as well as culture from the nephrostomy tube and adjust antibiotics as needed.  3.  Agree with supportive care and addition of pressors as needed to maintain MAPs per ICU protocol.  4.  Likely recommend repeat imaging in 48 hours to assess for the degree of residual perinephric fluid. If this is substantial I would recommend a second drain be placed in the perinephric space as this may be a nidus for ongoing infection.  5.  The patient will eventually need a nephrectomy once he has completely recovered from this acute illness, which will likely be arranged once he is discharged.  6.  Please do not hesitate to contact me and I will follow along with this patient closely. Appreciate all others participating in his care.   I spent greater than an hour, with greater than 50% of the time discussing the patient with Dr. Sherryll BurgerShah,  the ER physician as well as the interventional radiologist and coordinating his care.     ____________________________ Claris GladdenAshley J. Raylynn Hersh,  MD ajb:bu D: 08/03/2014 17:55:21 ET T: 08/03/2014 19:18:59 ET JOB#: [id number]  cc: Claris GladdenAshley J. Quintana Canelo, MD, <Dictator> Claris GladdenASHLEY J Khadija Thier MD ELECTRONICALLY SIGNED 08/31/2014 13:39

## 2014-10-23 NOTE — H&P (Signed)
PATIENT NAME:  [name] [name], [name] [name] MR#:  [id number] DATE OF BIRTH:  [birth year]  DATE OF ADMISSION:  09/29/2014  REFERRING PHYSICIAN:  Kathreen Devoid. Paduchowski, MD  PRIMARY CARE PHYSICIAN:  Meindert A. Lacie Scotts, MD   UROLOGIST:  Claris Gladden, MD   CHIEF COMPLAINT:  Foul urine.  HISTORY OF PRESENT ILLNESS:  This is a 69 year old Caucasian gentleman with past medical history of gastroesophageal reflux disease without esophagitis; COPD, non-O2 dependent; and nephrolithiasis complicated by nephrostomy tube requirement and subsequently urinoma with perinephric tube placement, presenting with foul urine. History is obtained from the patient as well as the daughter, who is present at bedside, as she is his primary caregiver. She notes having 5 to 6-day duration of discoloration of the perinephric drainage from a serosanguineous to brown-black in color with associated foul odor. She has also noticed this odor with increased sedimentation in the nephrostomy tube and urine. The patient has been complaining of left-sided abdominal pain described mainly as "fullness," 5 to 6 out of 10 in intensity, nonradiating, worse with activity, no relieving factors. She has had subjective fevers and chills as well and poor p.o. intake, really taking only 4 to 8 bottles of Ensure daily, no solid foods. With the above symptoms and concerns, they discussed the case with Dr. Apolinar Junes, the urologist, who recommended presenting to the hospital for further workup and evaluation.   REVIEW OF SYSTEMS: CONSTITUTIONAL:  Positive for subjective fever, chills, fatigue, and weakness.  EYES:  Denies blurred vision, double vision, or eye pain.  EARS, NOSE, AND THROAT:  Denies tinnitus, ear pain, or hearing loss.  RESPIRATORY:   Positive for cough, nonproductive.  CARDIOVASCULAR:  Denies chest pain, palpitations, or edema.  GASTROINTESTINAL:  Denies nausea, vomiting, or diarrhea. Positive abdominal pain as described above.   GENITOURINARY:  Denies dysuria or hematuria. Positive for change in ostomy output.  ENDOCRINE:  Denies nocturia or thyroid problems.  HEMATOLOGIC AND LYMPHATIC:  Denies easy bruising or bleeding.  SKIN:  Denies rashes or lesions.  MUSCULOSKELETAL:  Denies pain in the neck, back, shoulders, knees, or hips or arthritic symptoms.  NEUROLOGIC:  Denies any paralysis or paresthesias.  PSYCHIATRIC:  Denies anxiety or depressive symptoms.   Otherwise, full review of systems performed by me is negative.  PAST MEDICAL HISTORY:  Includes gastroesophageal reflux disease without esophagitis; COPD, non-O2 dependent; left nephrolithiasis requiring nephrostomy placement further complicated by urinoma with a perinephric tube placed.    SOCIAL HISTORY:  No alcohol use. Positive for tobacco usage.   FAMILY HISTORY:  Positive for coronary artery disease.   ALLERGIES:  No known drug allergies.   HOME MEDICATIONS:   benzonatate 200 mg p.o. 3 times daily as needed for cough, Advair 250/50 mcg inhalation 1 puff b.i.d., Perforomist 20 mcg/2 mL 2-minute ventilation b.i.d., ProAir 90 mcg inhalation 2 puffs q. 4 hours as needed for shortness of breath, Pepcid 20 mg p.o. daily, ferrous sulfate 325 mg p.o. b.i.d., senna-S 50/8.6 mg 2 tabs p.o. b.i.d. as needed for constipation, potassium 20 mEq daily.   PHYSICAL EXAMINATION: VITAL SIGNS:  Temperature 98.1, heart rate 116, respirations 27, blood pressure 92/60, saturating 97% on room air. Weight 131.5 kg, BMI 39.4.  GENERAL:  A chronically ill-appearing Caucasian gentleman in minimal distress given abdominal pain.  HEAD:  Normocephalic, atraumatic.  EYES:  Pupils are equal, round, and reactive to light. Extraocular muscles are intact. No scleral icterus.  MOUTH:  Dry mucosal membrane. Dentition is intact. No abscess noted.  EARS, NOSE, AND  THROAT:  Clear without exudates. No external lesions.  NECK:  Supple. No thyromegaly. No nodules. No JVD.  PULMONARY:   Right-sided coarse rhonchi at the bases without wheezing. Good respiratory effort.  CHEST:  Nontender to palpation.  CARDIOVASCULAR:  S1 and S2, tachycardic without murmurs, rubs, or gallops. No edema. Pedal pulses are 2+ bilaterally.  GASTROINTESTINAL:  Soft with minimal tenderness to palpation in the left upper quadrant without rebound, guarding, or motion tenderness. Positive bowel sounds. No appreciable hepatosplenomegaly, however, somewhat difficult given body habitus.  MUSCULOSKELETAL:  No swelling, clubbing, or edema. Range of motion is full in all extremities.  NEUROLOGIC:  Cranial nerves II through XII are intact. No gross focal neurological deficits. Sensation is intact. Reflexes are intact.  SKIN:  No ulceration, lesions, rashes, or cyanosis. There is a nephrostomy tube as well as a perinephric tube placed in the left side without surrounding erythema or discharge. Drainage from these tubes:  From the perinephric tube is brown and cloudy and from the nephrostomy tube appears to be yellow urine; however, there is a significant amount of white sediment present.  PSYCHIATRIC:  Mood and affect are within normal limits. The patient is awake, alert, and oriented x 3. Insight and judgment are intact.   LABORATORY DATA:  Urinalysis performed:  WBCs 16 to 30, RBCs 6 to 15, leukocyte esterase 3+, nitrite negative, epithelial cells 6, WBC clumping is present. Remainder of laboratory data:  Sodium is 133, potassium 4.6, chloride 96, bicarbonate 29, BUN 33, creatinine 0.9, glucose 93. LFTs:  Albumin 2.1, alkaline phosphatase 242, AST 57. WBC is 16.6, hemoglobin 10.5, platelets 409,000. INR is 1.1.   IMAGING STUDIES:  Chest x-ray performed:  Stable right lower lobe lung scarring. No acute cardiopulmonary process. 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   1.  Sepsis, meeting septic criteria from heart rate, leukocytosis, and respiratory rate present on arrival secondary to likely urinary source. Code sepsis was initiated and fluid bolus 30 mL/kg normal saline. Continue intravenous fluid hydration to keep mean arterial pressure greater than 65. We will initiate pressor therapy if required. Pan culture. Initiate broad-spectrum antibiotics. We already started her on Zosyn and vancomycin in the Emergency Department. We will continue these and taper according to culture data. We will consult urology, Dr. Apolinar Junes, for followup of care.  2.  Abnormal liver. CT questions metastasis for renal cell carcinoma. I discussed these findings with the patient and family. He will require further workup once initial sepsis has resolved.  3.  Chronic obstructive pulmonary disease, not in acute exacerbation, unspecified type. Provide supplemental oxygen to keep SaO2 greater than 92%. DuoNeb treatments q. 4 hours as required for shortness of breath. Continue with Advair as well as remainder of home medications.  4.  Gastroesophageal reflux disease without esophagitis.  Pepcid.  5.  Venous thromboembolism with heparin subcutaneously.   CODE STATUS:  The patient is a full code.    TIME SPENT:  45 minutes.    ____________________________ Cletis Athensavid K. Bobbi Kozakiewicz, MD dkh:nb D: 09/29/2014 23:43:53 ET T: 09/30/2014 00:18:50 ET JOB#: [id number]  cc: Cletis Athensavid K. Keylin Podolsky, MD, <Dictator> Kennley Schwandt Synetta ShadowK Jay Haskew MD ELECTRONICALLY SIGNED 09/30/2014 3:56

## 2014-10-23 DEATH — deceased

## 2014-11-01 ENCOUNTER — Inpatient Hospital Stay: Admit: 2014-11-01 | Payer: Self-pay | Admitting: Urology

## 2014-11-01 SURGERY — NEPHRECTOMY, PARTIAL
Anesthesia: General | Laterality: Left

## 2016-06-13 IMAGING — CT CT CHEST W/ CM
2 of 4 series · 14 of 36 positions shown, 17 images · IV contrast (omnipaque)
Comparison: CT Abdomen and Pelvis 08/03/2014 and earlier.

ADDENDUM:
CORRECTION:  The first sentence of the impression #4 should read:

"7 mm RIGHT lower lobe lung nodule."
CLINICAL DATA: 68-year-old male with plain radiographic findings
suspicious for obstructing right suprahilar mass with right apical
lung collapse. Initial encounter. Current history of chronic left
renal obstruction with hypotension and tachycardia treated with
percutaneous nephrostomy.
EXAM:
CT CHEST WITH CONTRAST
TECHNIQUE: Multidetector CT imaging of the chest was performed during
intravenous contrast administration.
CONTRAST:  60 mL Omnipaque 300.

[Series 2: routine chest with · axial · 0.77mm/px · z∈[-330,-55]mm · 11 of 66 slices shown, 14 images]
[im 6/66  mediastinal]
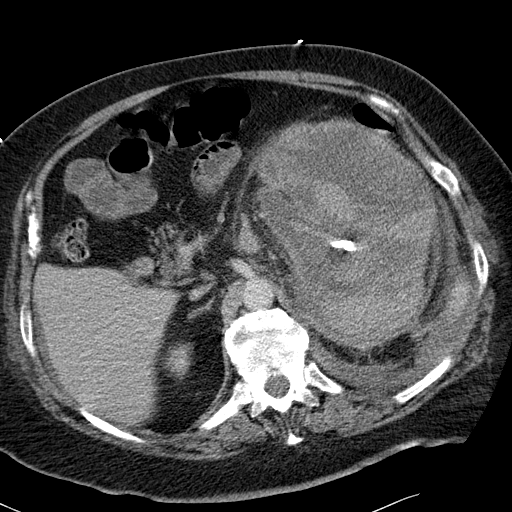
[im 6/66  lung]
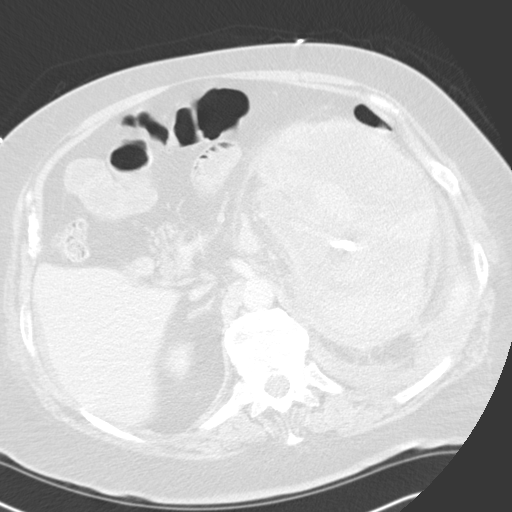
[im 11/66  lung]
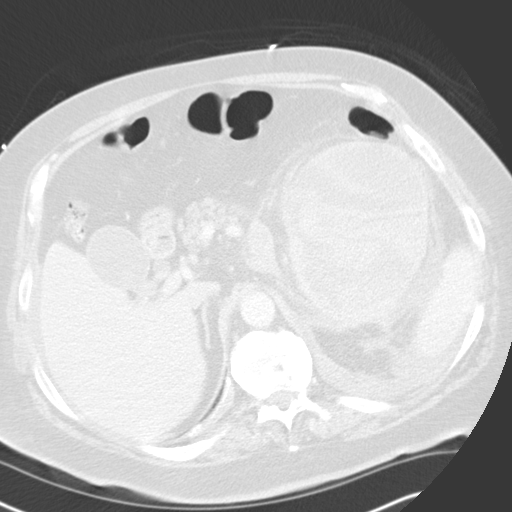
[im 16/66  lung]
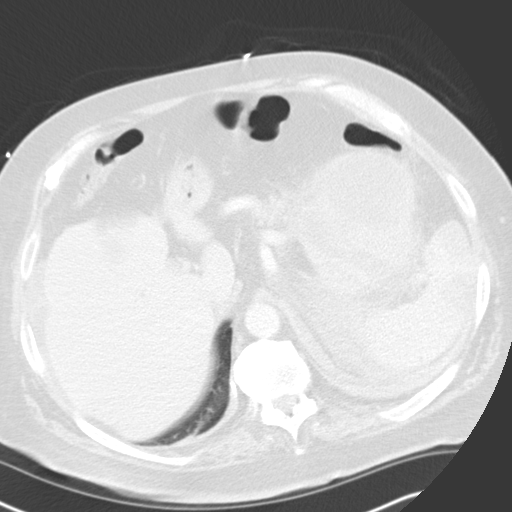
[im 21/66  lung]
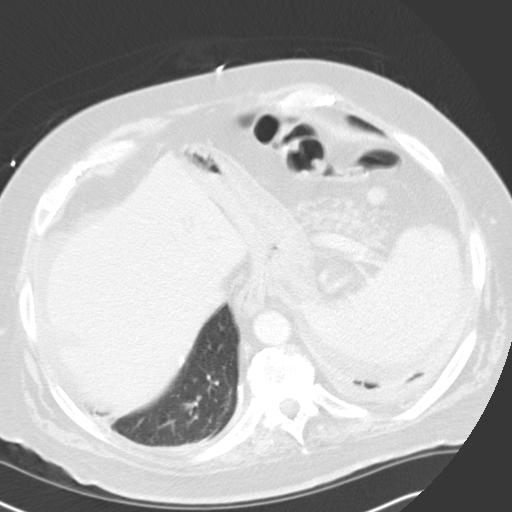
[im 26/66  mediastinal]
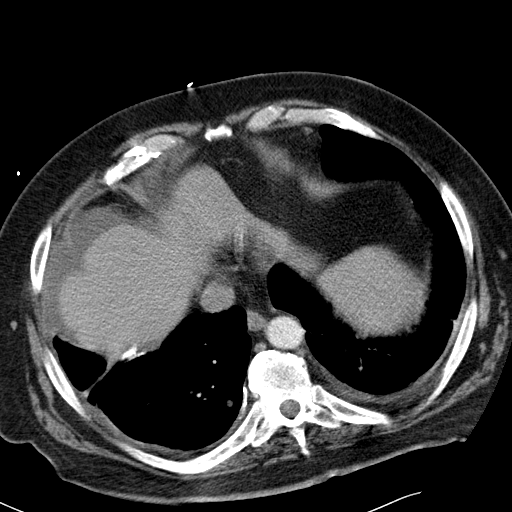
[im 26/66  lung]
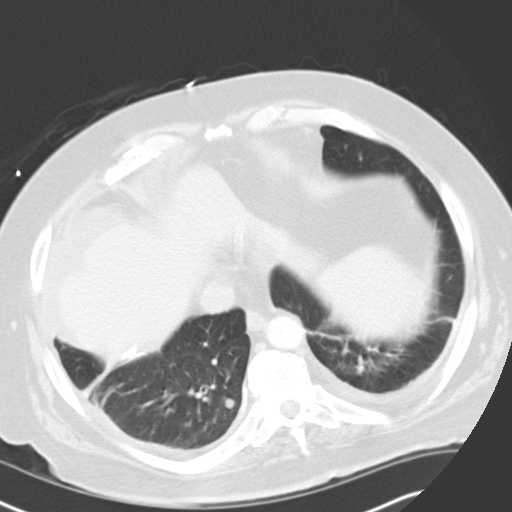
[im 36/66  lung]
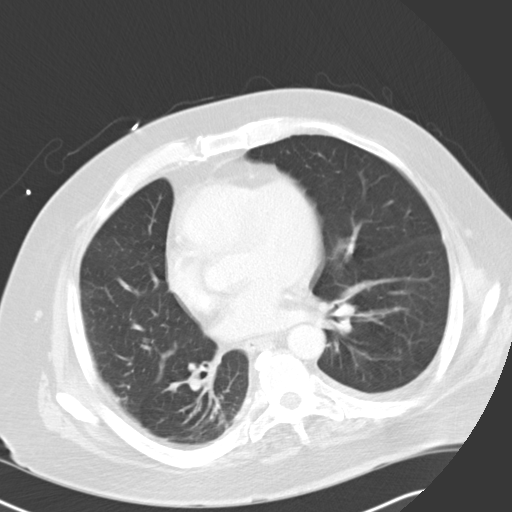
[im 41/66  lung]
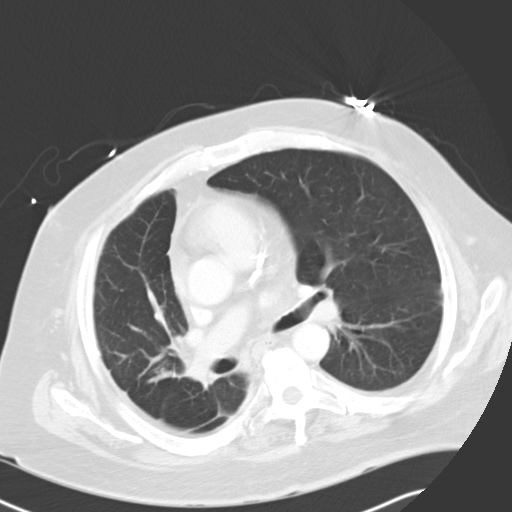
[im 46/66  lung]
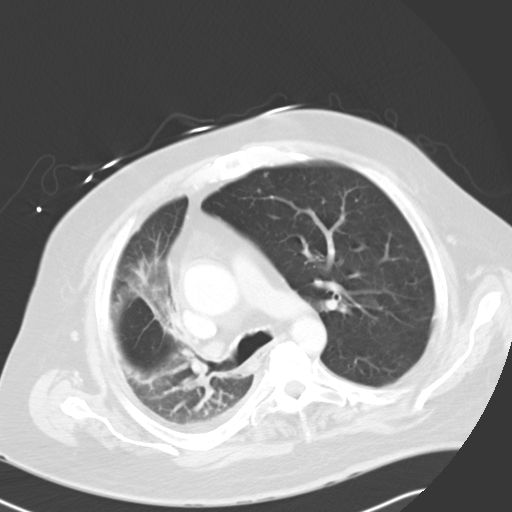
[im 51/66  mediastinal]
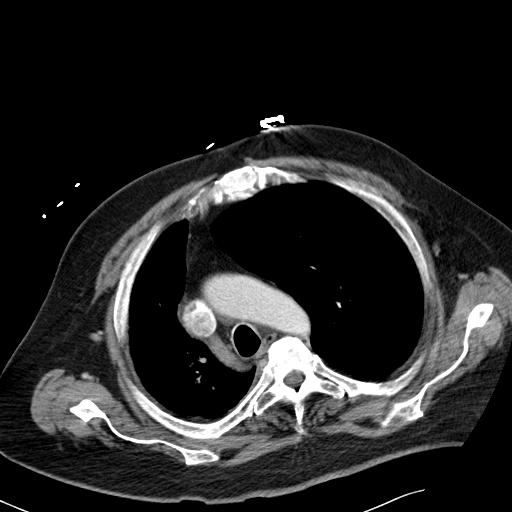
[im 51/66  lung]
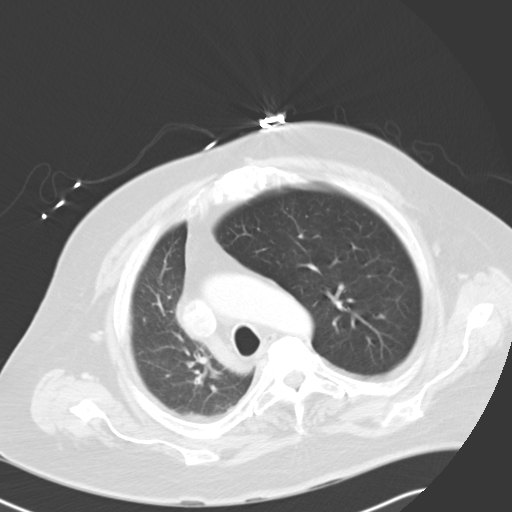
[im 56/66  lung]
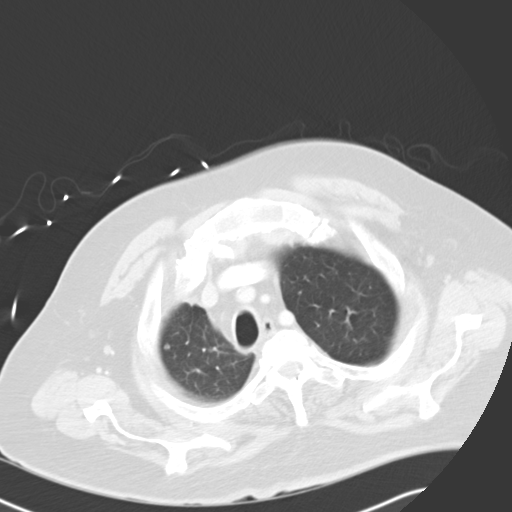
[im 61/66  lung]
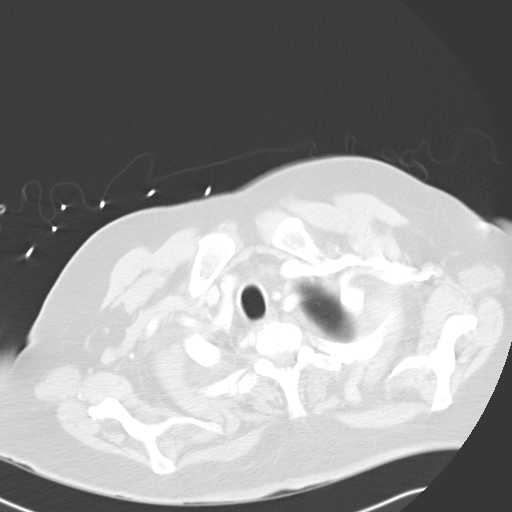

[Series 5: cor routine chest with · coronal · 0.66mm/px · 3 of 157 slices shown]
[im 32/157  lung]
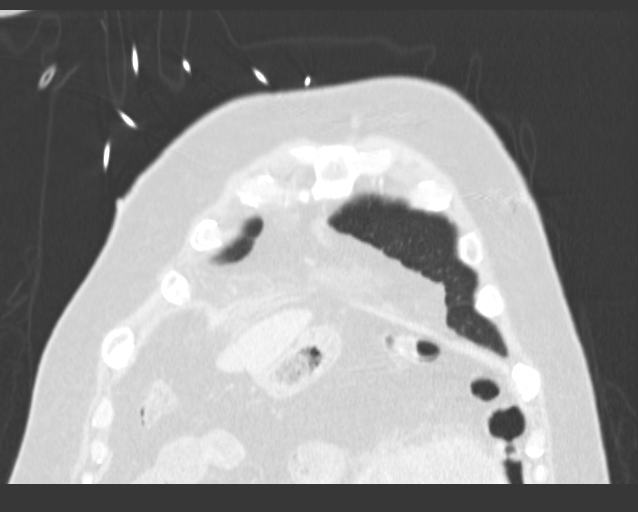
[im 63/157  lung]
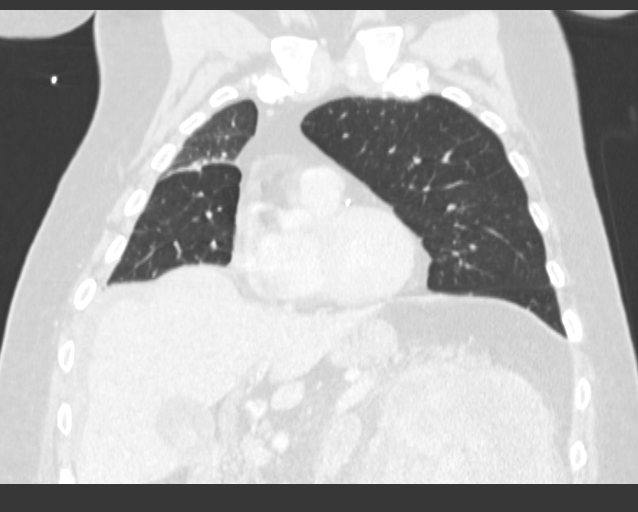
[im 94/157  lung]
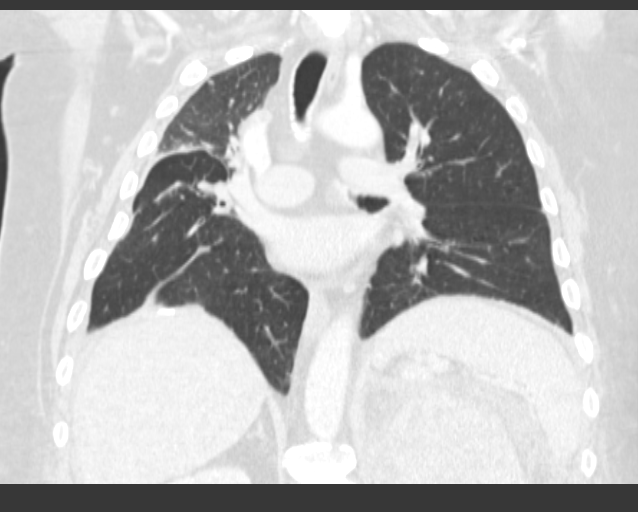

[14 of 36 positions shown; findings below may reference images not displayed]

FINDINGS: Trace left pleural effusion or pleural thickening. Posteriorly on
the right there is partially calcified pleural thickening mostly at
the costophrenic angle (series 3, image 44). There is associated
volume loss in the right hemi thorax which is new or increased since
11/27/2004. However, the major airways including those in the right
upper lobe do remain patent. There is multifocal right lung
peribronchial thickening, as well as some perihilar bronchiectasis.
However, there is no bone if I had right lung collapse. There is a 7
mm right lower lobe lung nodule on series 3, image 41.

No volume loss in the left hemi thorax. Mild if any left lung
peribronchial thickening. Evidence of a small layering left pleural
effusion.

No pericardial effusion. Calcified coronary artery atherosclerosis.
No mediastinal lymphadenopathy. 13 mm calcified left thyroid nodule,
does not meet size criteria for ultrasound follow-up in this age
group. No axillary lymphadenopathy.

There is now a small volume of perihepatic free fluid in the upper
abdomen. The massively enlarged and severely abnormal left kidney
appears not significantly changed aside from evidence now of a
pigtail catheter near the central aspect of the kidney. Perisplenic
stranding and fluid is perhaps mildly increased. There is persistent
hypodensity in the anterior pole of the spleen. Cholelithiasis re-
identified.

No acute osseous abnormality identified.
IMPRESSION: 1. There is volume loss in the right hemithorax but this appears to
be chronic and postinflammatory in nature and NOT related to any
obstructing mass or lobar collapse. There is widespread right lung
peribronchial thickening and some perihilar bronchiectasis.
2. Trace left pleural effusion.
3. Severely abnormal left kidney with new small volume of free fluid
and mildly increased left upper abdominal stranding since yesterday.
Partially visible percutaneous nephrostomy.
4. 7 mm left lower lobe lung nodule. If the patient is at high risk
for bronchogenic carcinoma, follow-up chest CT at 3-0months is
recommended. If the patient is at low risk for bronchogenic
carcinoma, follow-up chest CT at 6-12 months is recommended. This
recommendation follows the consensus statement: Guidelines for
Management of Small Pulmonary Nodules Detected on CT Scans: A
Statement from the [HOSPITAL] as published in Radiology
